# Patient Record
Sex: Female | Born: 1975 | Race: White | Hispanic: No | Marital: Married | Smoking: Former smoker
Health system: Southern US, Community
[De-identification: ages and names within clinical notes are randomized; demographics above are authoritative.]

## PROBLEM LIST (undated history)

## (undated) DIAGNOSIS — Z789 Other specified health status: Secondary | ICD-10-CM

## (undated) HISTORY — DX: Other specified health status: Z78.9

## (undated) HISTORY — PX: COLON SURGERY: SHX602

## (undated) HISTORY — PX: BREAST SURGERY: SHX581

---

## 2011-11-12 LAB — HM MAMMOGRAPHY

## 2012-09-21 ENCOUNTER — Ambulatory Visit: Payer: Self-pay | Admitting: Obstetrics and Gynecology

## 2013-08-09 IMAGING — US ULTRASOUND RIGHT BREAST
1 series · 14 of 20 positions shown · non-contrast
Comparison: none

REASON FOR EXAM: RT BR MASS 10 OCLOCK
COMMENTS:

PROCEDURE:     US  - US BREAST RIGHT  - September 21, 2012  [DATE]
RESULT:     Right breast ultrasound reveals a hypoechoic solid nodule at
[DATE] in the right breast. Ultrasound directed needle localization for
surgical removal can be obtained as a tiny tumor cannot be excluded.

[Series 1: ultrasound right breast · 0.08mm/px · 14 of 20 slices shown]
[im 1/20]
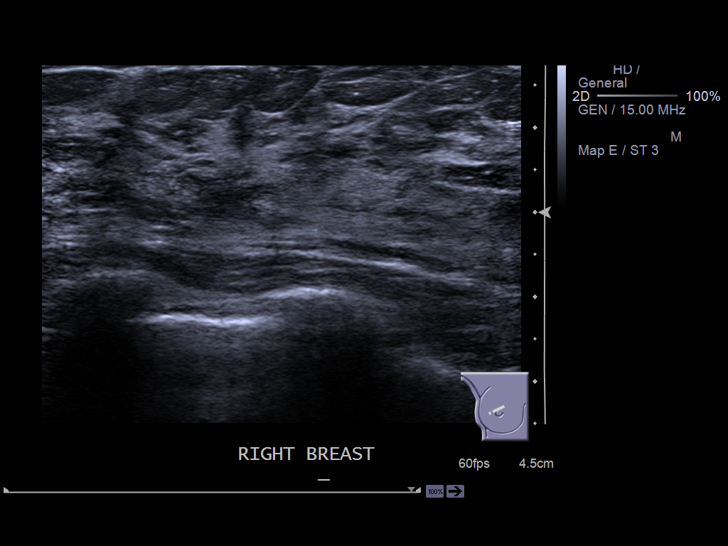
[im 3/20]
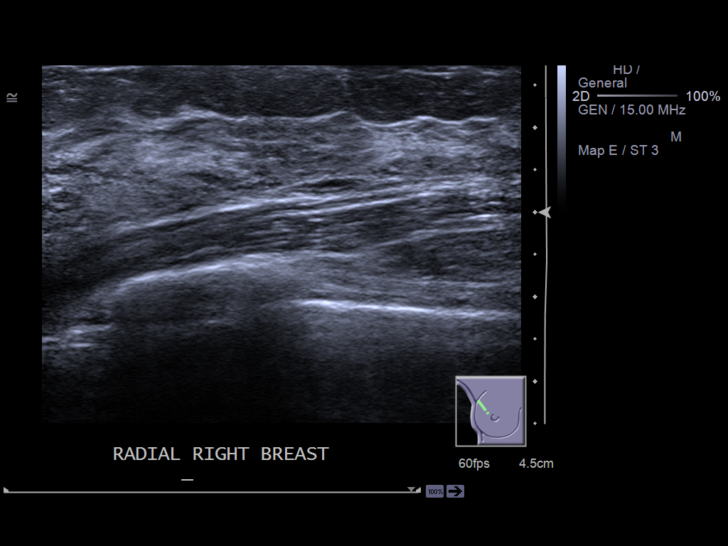
[im 4/20]
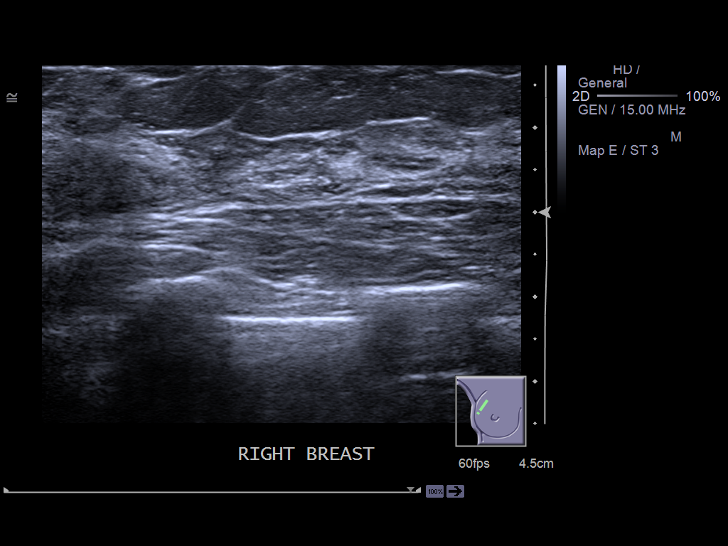
[im 6/20]
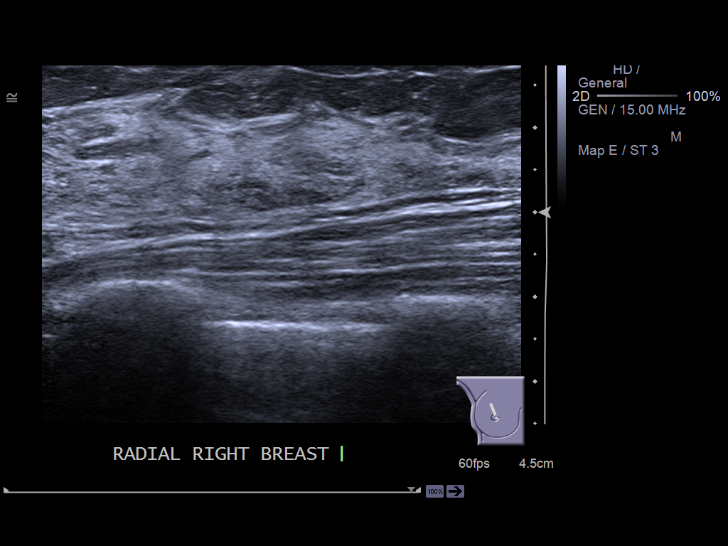
[im 7/20]
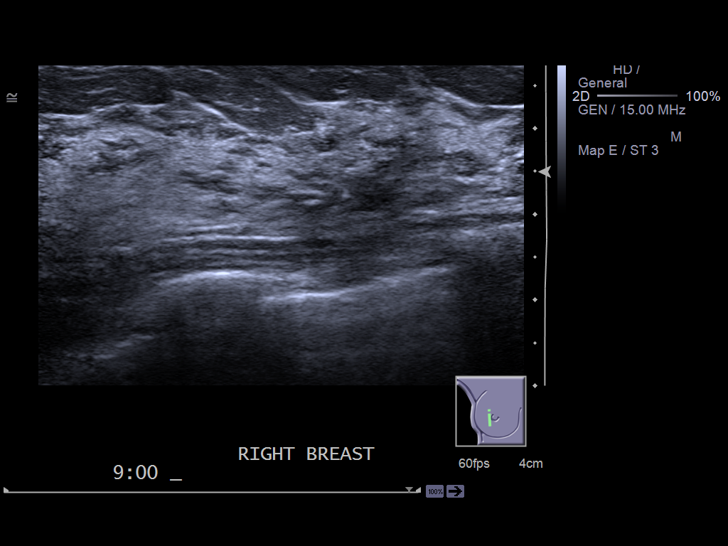
[im 8/20]
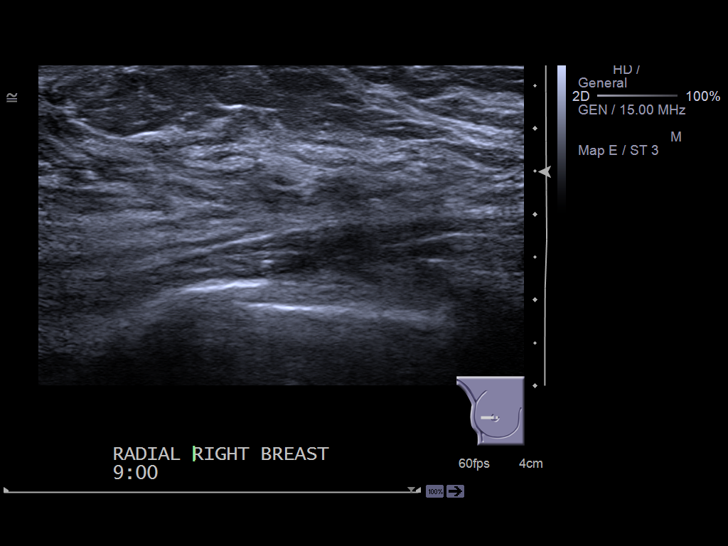
[im 10/20]
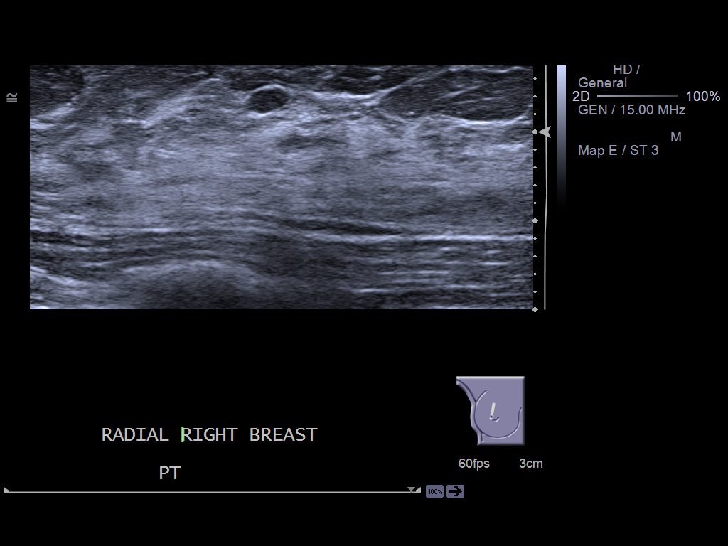
[im 11/20]
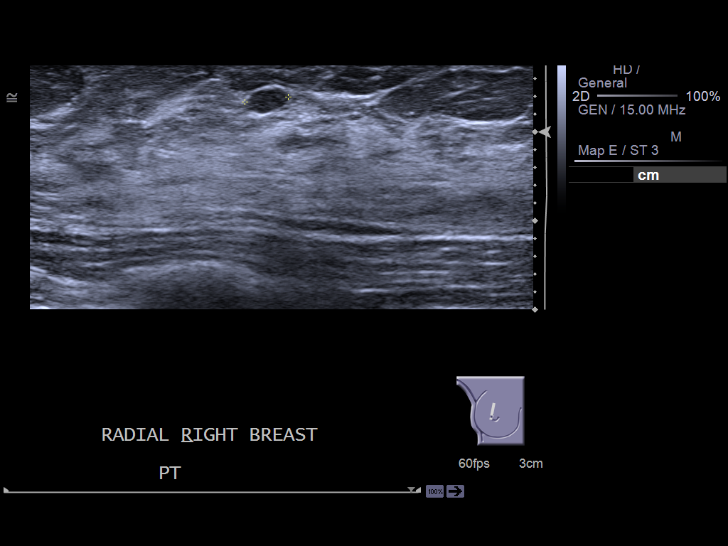
[im 13/20]
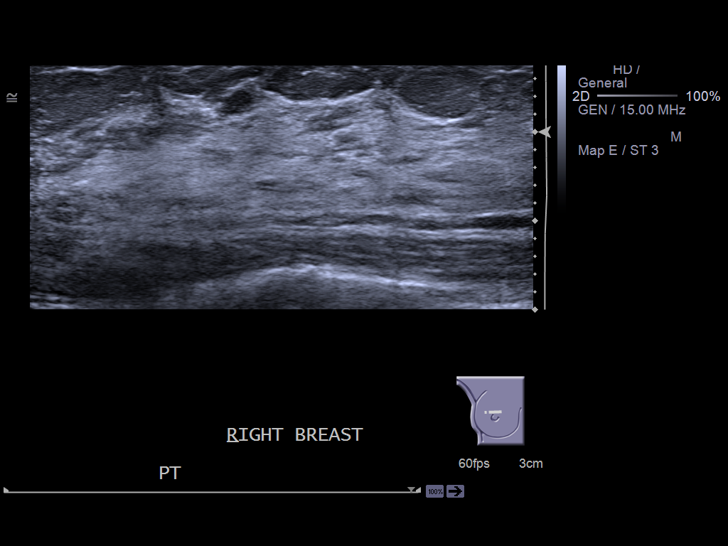
[im 14/20]
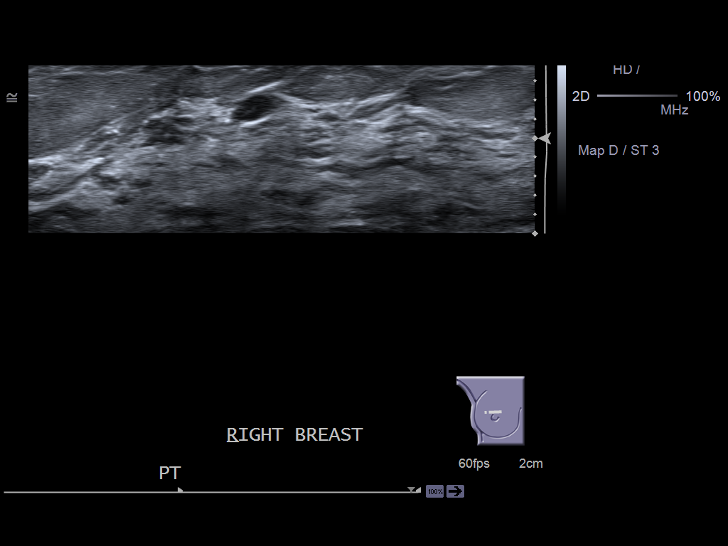
[im 16/20]
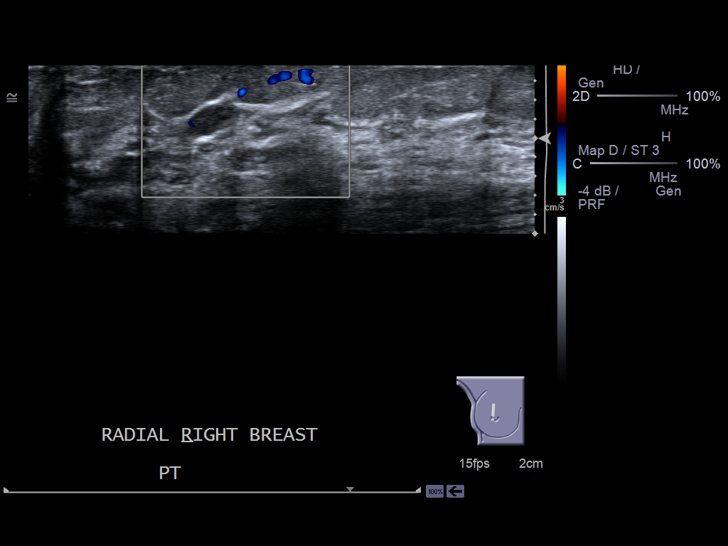
[im 17/20]
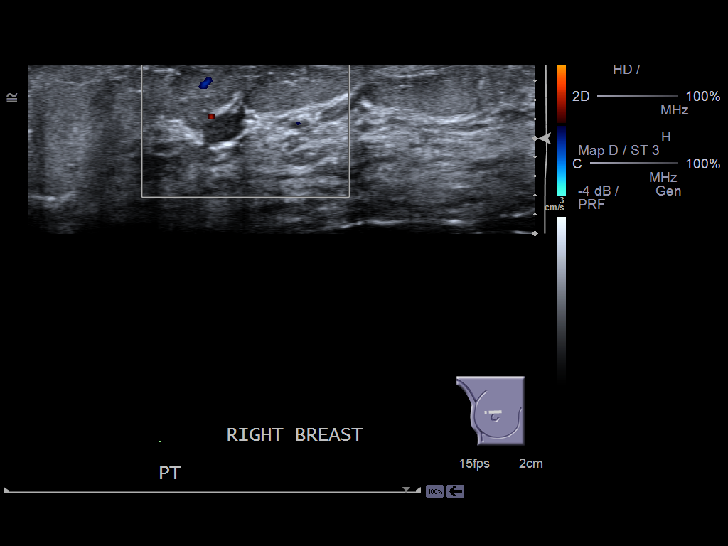
[im 18/20]
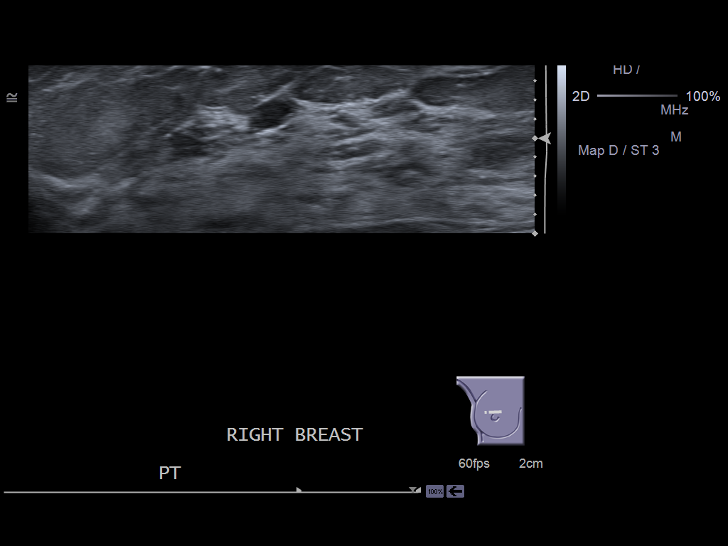
[im 20/20]
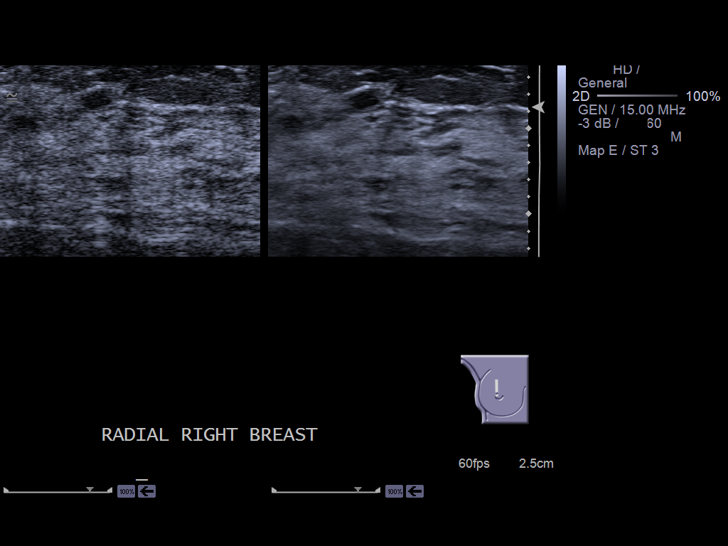

[14 of 20 positions shown; findings below may reference images not displayed]

IMPRESSION: 5 mm solid nodule [DATE] right breast for which ultrasound
directed needle localization for surgical removal can be obtained.

BI-RADS: Category 4 - Suspicious Abnormality - Biopsy Should Be Considered

A NEGATIVE MAMMOGRAM REPORT DOES NOT PRECLUDE BIOPSY OR OTHER EVALUATION OF
A CLINICALLY PALPABLE OR OTHERWISE SUSPICIOUS MASS OR LESION. BREAST CANCER
MAY NOT BE DETECTED IN UP TO 10% OF CASES.

## 2013-08-09 IMAGING — MG MAM DGTL DIAGNOSTIC MAMMO W/CAD
1 series · 8 of 8 positions shown · non-contrast
Comparison: none

REASON FOR EXAM: RT BR MASS 10 OCLOCK
COMMENTS:

[R CC · right · 8 of 8 slices shown]
[im 1/8]
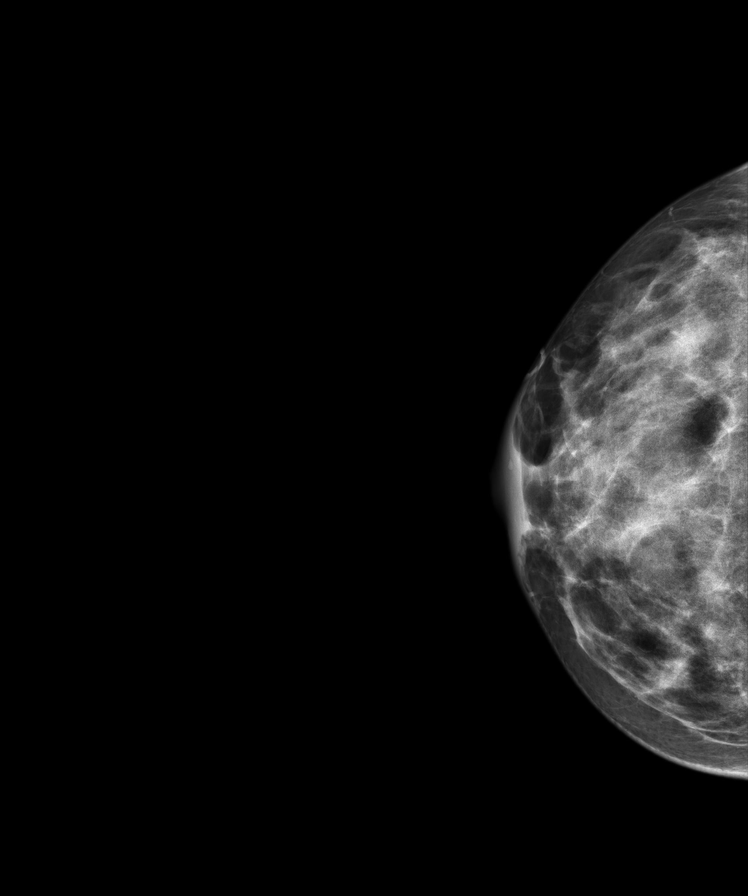
[im 2/8]
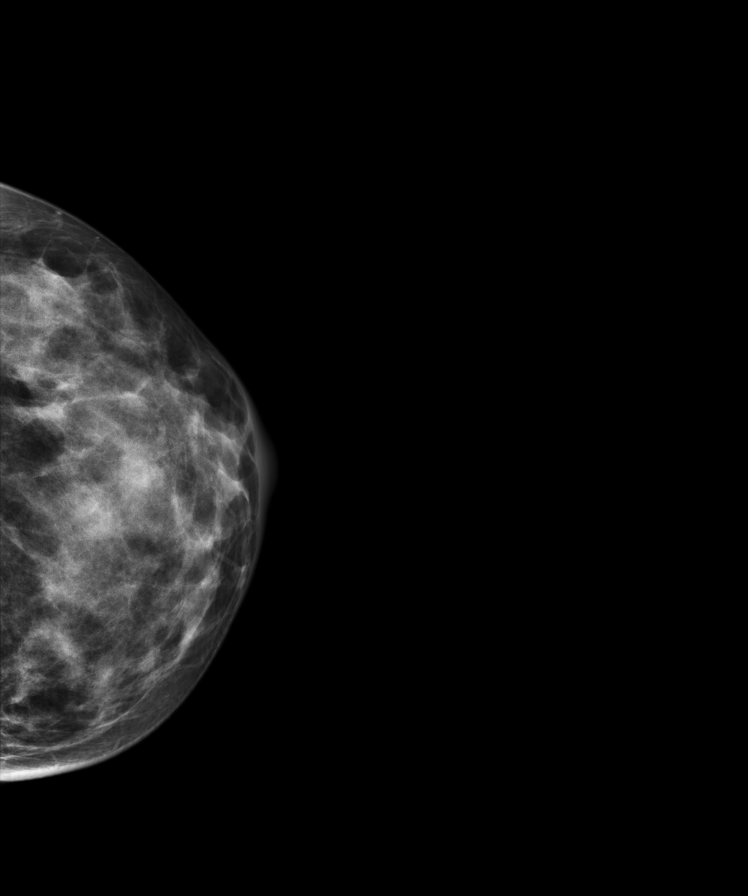
[im 3/8]
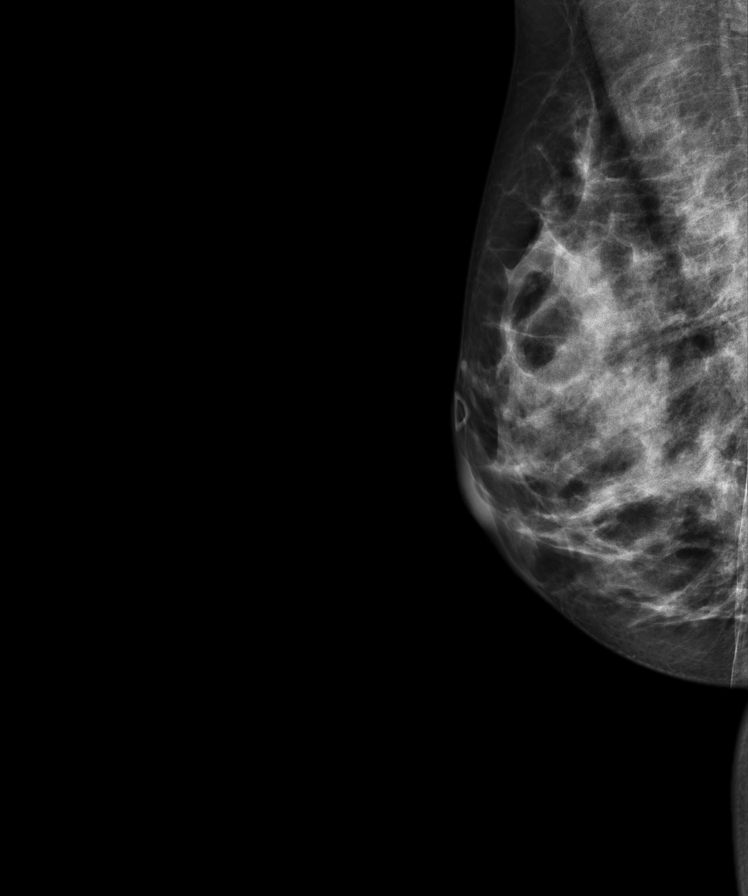
[im 4/8]
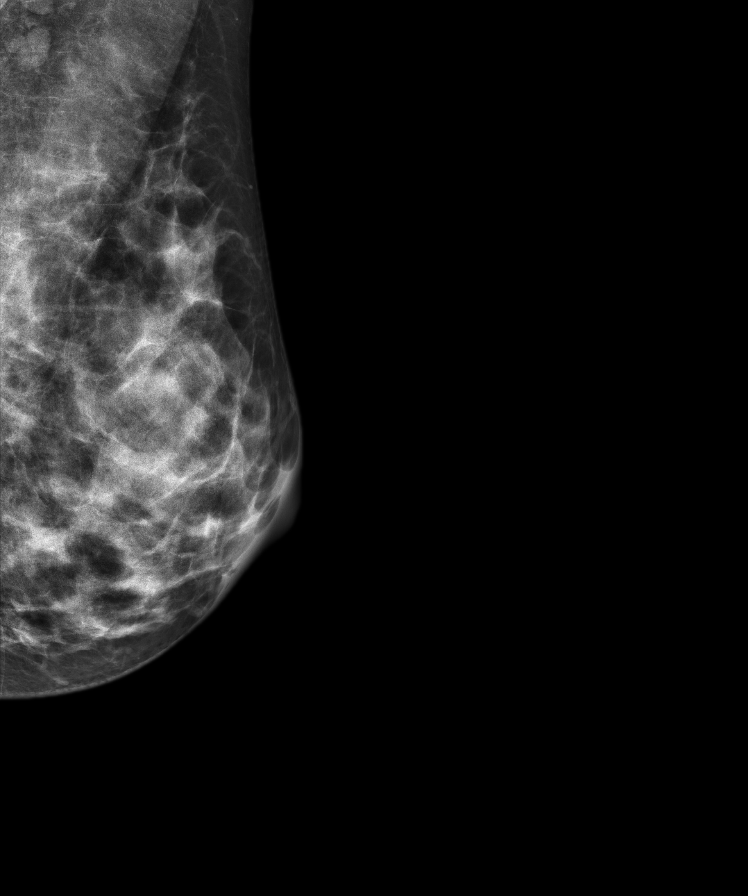
[im 5/8]
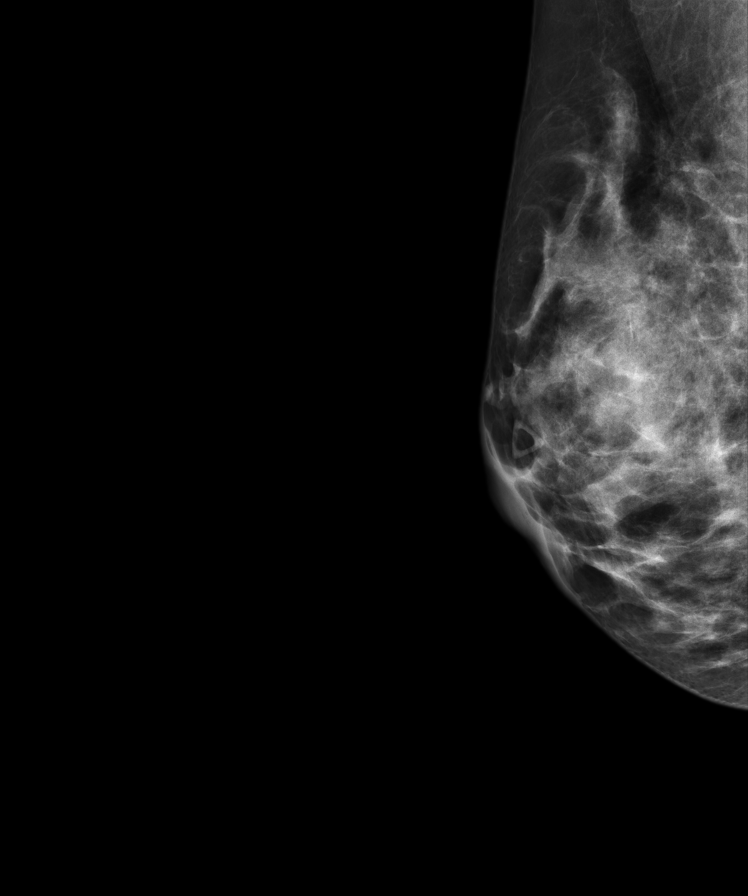
[im 6/8]
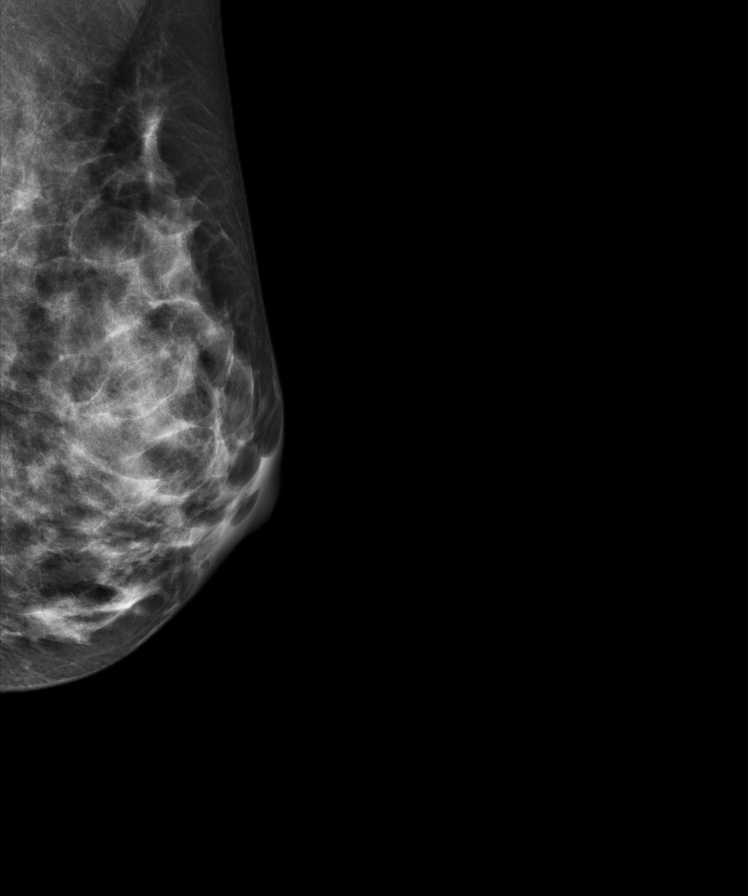
[im 7/8]
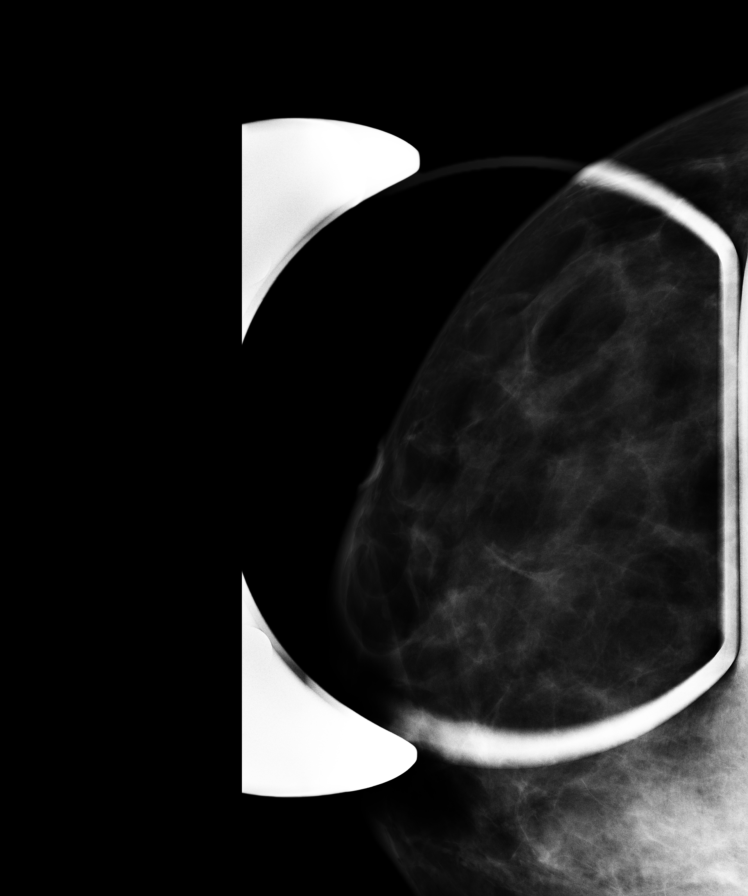
[im 8/8]
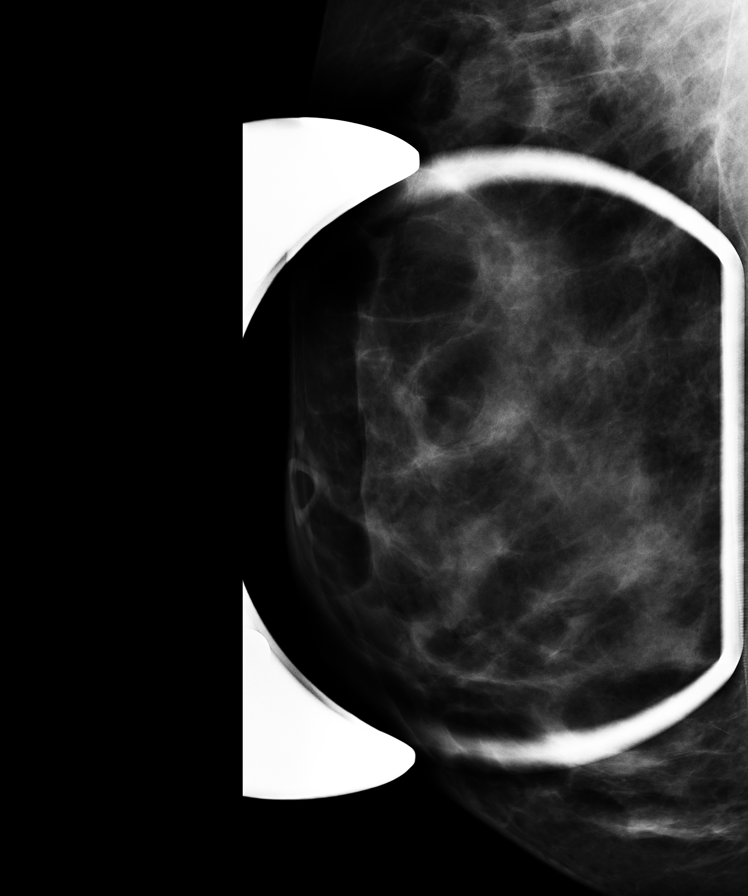

[8 of 8 positions shown; findings below may reference images not displayed]

PROCEDURE:     MAM - MAM DGTL DIAGNOSTIC MAMMO W/CAD  - September 21, 2012  [DATE]

RESULT:     No prior studies available for comparison. Breast are dense. No
mass pathologic clustered calcification. CAD evaluation nonfocal. Small
axillary lymph nodes. Ultrasound reveals a 5 mm solid nodule at [DATE] in the
right breast for which ultrasound directed needle localization for surgical
removal to exclude tumor suggested.
IMPRESSION: BI-RADS: Category 4 - Suspicious Abnormality - Biopsy
Should Be Considered

A NEGATIVE MAMMOGRAM REPORT DOES NOT PRECLUDE BIOPSY OR OTHER EVALUATION OF
A CLINICALLY PALPABLE OR OTHERWISE SUSPICIOUS MASS OR LESION. BREAST CANCER
MAY NOT BE DETECTED IN UP TO 10% OF CASES.

## 2014-04-16 ENCOUNTER — Ambulatory Visit: Payer: Self-pay | Admitting: Emergency Medicine

## 2014-04-18 ENCOUNTER — Ambulatory Visit: Payer: Self-pay | Admitting: Physician Assistant

## 2014-07-01 LAB — CBC AND DIFFERENTIAL: HEMOGLOBIN: 14.6 g/dL (ref 12.0–16.0)

## 2015-09-20 ENCOUNTER — Other Ambulatory Visit: Payer: Self-pay | Admitting: Internal Medicine

## 2015-09-20 ENCOUNTER — Encounter: Payer: Self-pay | Admitting: Internal Medicine

## 2015-09-20 DIAGNOSIS — M545 Low back pain, unspecified: Secondary | ICD-10-CM | POA: Insufficient documentation

## 2015-09-20 DIAGNOSIS — N6019 Diffuse cystic mastopathy of unspecified breast: Secondary | ICD-10-CM | POA: Insufficient documentation

## 2016-10-30 DIAGNOSIS — D241 Benign neoplasm of right breast: Secondary | ICD-10-CM | POA: Insufficient documentation

## 2016-10-30 HISTORY — DX: Benign neoplasm of right breast: D24.1

## 2017-10-14 HISTORY — PX: BREAST EXCISIONAL BIOPSY: SUR124

## 2019-09-07 DIAGNOSIS — N182 Chronic kidney disease, stage 2 (mild): Secondary | ICD-10-CM | POA: Insufficient documentation

## 2019-09-07 DIAGNOSIS — Z841 Family history of disorders of kidney and ureter: Secondary | ICD-10-CM | POA: Insufficient documentation

## 2020-02-26 DIAGNOSIS — Z8371 Family history of colonic polyps: Secondary | ICD-10-CM | POA: Insufficient documentation

## 2020-02-26 DIAGNOSIS — Z83719 Family history of colon polyps, unspecified: Secondary | ICD-10-CM | POA: Insufficient documentation

## 2020-02-26 DIAGNOSIS — D126 Benign neoplasm of colon, unspecified: Secondary | ICD-10-CM | POA: Insufficient documentation

## 2021-06-07 ENCOUNTER — Other Ambulatory Visit: Payer: Self-pay

## 2021-06-07 ENCOUNTER — Ambulatory Visit
Admission: RE | Admit: 2021-06-07 | Discharge: 2021-06-07 | Disposition: A | Discharge status: Home / Self Care | Payer: Self-pay | Source: Ambulatory Visit | Attending: Sports Medicine | Admitting: Sports Medicine

## 2021-06-07 VITALS — BP 124/86 | HR 95 | Temp 99.0°F | Resp 16 | Ht 63.0 in | Wt 150.0 lb

## 2021-06-07 DIAGNOSIS — J029 Acute pharyngitis, unspecified: Secondary | ICD-10-CM | POA: Insufficient documentation

## 2021-06-07 DIAGNOSIS — H68001 Unspecified Eustachian salpingitis, right ear: Secondary | ICD-10-CM | POA: Insufficient documentation

## 2021-06-07 DIAGNOSIS — R0981 Nasal congestion: Secondary | ICD-10-CM | POA: Insufficient documentation

## 2021-06-07 DIAGNOSIS — R059 Cough, unspecified: Secondary | ICD-10-CM | POA: Insufficient documentation

## 2021-06-07 DIAGNOSIS — H938X1 Other specified disorders of right ear: Secondary | ICD-10-CM | POA: Insufficient documentation

## 2021-06-07 LAB — POCT RAPID STREP A: Streptococcus, Group A Screen (Direct): NEGATIVE

## 2021-06-07 MED ORDER — PROMETHAZINE-DM 6.25-15 MG/5ML PO SYRP: 5.0000 mL | ORAL_SOLUTION | Freq: Four times a day (QID) | ORAL | 0 refills | Status: DC | PRN

## 2021-06-07 MED ORDER — FLUTICASONE PROPIONATE 50 MCG/ACT NA SUSP: 2.0000 | Freq: Every day | NASAL | 0 refills | Status: DC

## 2021-06-07 NOTE — Discharge Instructions (Addendum)
As we discussed, your strep test is negative. Your symptoms are very consistent with the recent COVID positive test that you have had.  This may linger.  It is a viral process, no antibiotics are indicated. That said, I did prescribe you a cough medicine, and a nasal steroid to help you with your congestion and your ear pressure. Please see educational handouts. If symptoms persist please see your primary care provider. I have to advise you, that if your symptoms do worsen then you need to seek out care in emergency room setting.

## 2021-06-07 NOTE — ED Provider Notes (Signed)
[facility]    CSN: [id number] Arrival date & time: 06/07/21  1605      History   Chief Complaint Chief Complaint  Patient presents with   Sore Throat    HPI Mikayla Bradley is a 45 y.o. female.   44 year old female who presents for evaluation of a sore throat.  She said her sore throat began about 6 days ago.  She actually took a home COVID test and was positive.  She quarantine for the past 5 days.  Her throat is still bothering her and she also has an associated cough.  She normally goes to [facility] family [facility] but was unable to get an appointment.  She is not currently working outside of the home.  She says the cough is quite irritating and sometimes is keeping her awake at night.  No fever shakes chills.  She thinks the cough is irritating her throat.  No abdominal or urinary symptoms.  No history of asthma.  No smoking.  No wheezing.  She also has associated nasal congestion and some mild right-sided ear pressure.  She has been vaccinated x2 but no booster.  No flu shot.  No chest pain or shortness of breath.  She denies any major medical issues and does not take medicines on a regular basis.  No red flag signs or symptoms elicited on history.   History reviewed. No pertinent past medical history.  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 No pertinent surgical history.  OB History   No obstetric history on file.      Home Medications    Prior to Admission medications   Medication Sig Start Date End Date Taking? Authorizing Provider  fluticasone (FLONASE) 50 MCG/ACT nasal spray Place 2 sprays into both nostrils daily. 06/07/21  Yes Verda Cumins, MD  levonorgestrel [facility]) 20 MCG/24HR IUD by Intrauterine route.   Yes [provider]  promethazine-dextromethorphan (PROMETHAZINE-DM) 6.25-15 MG/5ML syrup Take 5 mLs by mouth 4 (four) times daily as  needed for cough. 06/07/21  Yes Verda Cumins, MD    Family History History reviewed. No pertinent family history.  Social History Social History   Tobacco Use   Smoking status: Never   Smokeless tobacco: Never  Substance Use Topics   Alcohol use: Yes    Alcohol/week: 0.0 standard drinks     Allergies   Patient has no known allergies.   Review of Systems Review of Systems  Constitutional:  Negative for activity change, appetite change, chills, diaphoresis, fatigue and fever.  HENT:  Positive for congestion, ear pain and sore throat. Negative for ear discharge, postnasal drip, rhinorrhea, sinus pressure, sinus pain and sneezing.   Eyes:  Negative for pain.  Respiratory:  Positive for cough. Negative for chest tightness, shortness of breath and wheezing.   Cardiovascular:  Negative for chest pain and palpitations.  Gastrointestinal:  Negative for abdominal pain, diarrhea, nausea and vomiting.  Genitourinary:  Negative for dysuria.  Musculoskeletal:  Negative for back pain, myalgias and neck pain.  Skin:  Negative for color change, pallor, rash and wound.  Neurological:  Negative for dizziness, light-headedness and headaches.  All other systems reviewed and are negative.   Physical Exam Triage Vital Signs ED Triage Vitals [06/07/21 1620]  Enc Vitals Group     BP      Pulse      Resp  Temp      Temp src      SpO2      Weight 150 lb (68 kg)     Height '5\' 3"'$  (1.6 m)     Head Circumference      Peak Flow      Pain Score 7     Pain Loc      Pain Edu?      Excl. in [place]?    No data found.  Updated Vital Signs BP 124/86 (BP Location: Left Arm)   Pulse 95   Temp 99 F (37.2 C) (Oral)   Resp 16   Ht '5\' 3"'$  (1.6 m)   Wt 68 kg   SpO2 97%   BMI 26.57 kg/m   Visual Acuity Right Eye Distance:   Left Eye Distance:   Bilateral Distance:    Right Eye Near:   Left Eye Near:    Bilateral Near:     Physical Exam Vitals and nursing note reviewed.   Constitutional:      General: She is not in acute distress.    Appearance: Normal appearance. She is well-developed. She is not ill-appearing, toxic-appearing or diaphoretic.  HENT:     Head: Normocephalic and atraumatic.     Right Ear: Tympanic membrane normal.     Left Ear: Tympanic membrane normal.     Nose: Congestion present. No rhinorrhea.     Mouth/Throat:     Mouth: Mucous membranes are moist. No oral lesions.     Pharynx: Uvula midline. Posterior oropharyngeal erythema present. No pharyngeal swelling, oropharyngeal exudate or uvula swelling.     Tonsils: No tonsillar exudate or tonsillar abscesses. 0 on the right. 0 on the left.  Eyes:     Extraocular Movements:     Right eye: Normal extraocular motion.     Left eye: Normal extraocular motion.     Conjunctiva/sclera: Conjunctivae normal.     Pupils: Pupils are equal, round, and reactive to light.  Cardiovascular:     Rate and Rhythm: Normal rate and regular rhythm.     Pulses: Normal pulses.     Heart sounds: Normal heart sounds. No murmur heard.   No friction rub. No gallop.  Pulmonary:     Effort: Pulmonary effort is normal.     Breath sounds: Normal breath sounds. No stridor. No wheezing, rhonchi or rales.  Musculoskeletal:     Cervical back: Normal range of motion and neck supple.  Skin:    General: Skin is warm and dry.     Capillary Refill: Capillary refill takes less than 2 seconds.     Findings: No erythema or rash.  Neurological:     General: No focal deficit present.     Mental Status: She is alert and oriented to person, place, and time.     UC Treatments / Results  Labs (all labs ordered are listed, but only abnormal results are displayed) Labs Reviewed  CULTURE, GROUP A STREP [facility])  POCT RAPID STREP A, ED / UC  POCT RAPID STREP A    EKG   Radiology No results found.  Procedures Procedures (including critical care time)  Medications Ordered in UC Medications - No data to  display  Initial Impression / Assessment and Plan / UC Course  I have reviewed the triage vital signs and the nursing notes.  Pertinent labs & imaging results that were available during my care of the patient were reviewed by me and considered in my medical decision  making (see chart for details).  Clinical impression: 1.  Sore throat 2.  Cough 3.  Nasal congestion 4.  Right ear pressure 5.  Inflammation of the right eustachian tube  Treatment plan: 1.  The findings and treatment plan were discussed in detail with the patient.  Patient was in agreement. 2.  I did indicate to her that her symptoms are consistent with her recent COVID positive test.  She is currently afebrile but is having cough, nasal congestion and ear discomfort.  We will treat accordingly. 3.  Did send in a prescription for cough medicine, and Flonase.  This should help her. 4.  Supportive care, over-the-counter meds as needed, Tylenol or Motrin for any fever or discomfort.  Plenty of rest and plenty fluids. 5.  Educational handouts provided. 6.  If symptoms persist she should see her PCP. 7.  If symptoms worsen that she should go to the ER. 8.  She was discharged in stable condition will follow-up here as needed.    Final Clinical Impressions(s) / UC Diagnoses   Final diagnoses:  Sore throat  Cough  Nasal congestion  Ear pressure, right  Inflammation of right eustachian tube     Discharge Instructions      As we discussed, your strep test is negative. Your symptoms are very consistent with the recent COVID positive test that you have had.  This may linger.  It is a viral process, no antibiotics are indicated. That said, I did prescribe you a cough medicine, and a nasal steroid to help you with your congestion and your ear pressure. Please see educational handouts. If symptoms persist please see your primary care provider. I have to advise you, that if your symptoms do worsen then you need to seek out  care in emergency room setting.     ED Prescriptions     Medication Sig Dispense Auth. Provider   promethazine-dextromethorphan (PROMETHAZINE-DM) 6.25-15 MG/5ML syrup Take 5 mLs by mouth 4 (four) times daily as needed for cough. 180 mL Verda Cumins, MD   fluticasone [facility]) 50 MCG/ACT nasal spray Place 2 sprays into both nostrils daily. 15.8 mL Verda Cumins, MD      PDMP not reviewed this encounter.   Verda Cumins, MD 06/07/21 [phone]

## 2021-06-07 NOTE — ED Triage Notes (Signed)
Pt here with C/O sore throat started Friday night, tested at home Covid was positive Saturday morning. Well cough up phlegm in the mornings but no other SX. No fever, cough.

## 2021-06-10 LAB — CULTURE, GROUP A STREP (THRC)

## 2022-01-14 ENCOUNTER — Other Ambulatory Visit: Payer: Self-pay

## 2022-01-14 ENCOUNTER — Ambulatory Visit: Payer: BC Managed Care – PPO | Admitting: Nurse Practitioner

## 2022-01-14 ENCOUNTER — Encounter: Payer: Self-pay | Admitting: Nurse Practitioner

## 2022-01-14 VITALS — BP 122/70 | Temp 98.0°F | Resp 16 | Ht 63.0 in | Wt 159.7 lb

## 2022-01-14 DIAGNOSIS — Z124 Encounter for screening for malignant neoplasm of cervix: Secondary | ICD-10-CM

## 2022-01-14 DIAGNOSIS — D126 Benign neoplasm of colon, unspecified: Secondary | ICD-10-CM | POA: Diagnosis not present

## 2022-01-14 DIAGNOSIS — Z13220 Encounter for screening for lipoid disorders: Secondary | ICD-10-CM

## 2022-01-14 DIAGNOSIS — D241 Benign neoplasm of right breast: Secondary | ICD-10-CM

## 2022-01-14 DIAGNOSIS — Z130 Encounter for screening for diseases of the blood and blood-forming organs and certain disorders involving the immune mechanism: Secondary | ICD-10-CM

## 2022-01-14 DIAGNOSIS — Z1231 Encounter for screening mammogram for malignant neoplasm of breast: Secondary | ICD-10-CM

## 2022-01-14 DIAGNOSIS — Z131 Encounter for screening for diabetes mellitus: Secondary | ICD-10-CM

## 2022-01-14 DIAGNOSIS — Z8371 Family history of colonic polyps: Secondary | ICD-10-CM

## 2022-01-14 DIAGNOSIS — Z7689 Persons encountering health services in other specified circumstances: Secondary | ICD-10-CM

## 2022-01-14 DIAGNOSIS — Z3009 Encounter for other general counseling and advice on contraception: Secondary | ICD-10-CM | POA: Diagnosis not present

## 2022-01-14 NOTE — Progress Notes (Signed)
? ?BP 122/70   Temp 98 ?F (36.7 ?C) (Oral)   Resp 16   Ht '5\' 3"'$  (1.6 m)   Wt 159 lb 11.2 oz (72.4 kg)   SpO2 99%   BMI 28.29 kg/m?   ? ?Subjective:  ? ? Patient ID: Mikayla Bradley, female    DOB: 03-08-1976, 46 y.o.   MRN: [id number] ? ?HPI: ?Mikayla Bradley is a 46 y.o. female, here alone ? ?Chief Complaint  ?Patient presents with  ? Establish Care  ? ?Establish care: Last physical was done in 2020, she says that everything was normal. No medical history. She had her first colonoscopy last year, she did have some polyps they were removed and she is due for another in five years. She is due for pap and mammogram. ? ?Polyps: she has a family history of colon polyps.  She recently did a colonoscopy and had some polyps removed. She will be due for another colonoscopy in five years. She denies any bloody stools.  ? ?Fibroadenoma of right breast: She says that she had a fibroadenoma of her right breast but had it removed in 05/19/2018.  She says she is due for a mammogram.  Mammogram ordered.  ? ?IUD: she says she has an IUD in place and it is good for one more year.  She would like to go to [facility] for her GYN care.  Referral placed.  She says she is due for her pap and will get it done there.  ? ? ?Relevant past medical, surgical, family and social history reviewed and updated as indicated. Interim medical history since our last visit reviewed. ?Allergies and medications reviewed and updated. ? ?Review of Systems ? ?Constitutional: Negative for fever or weight change.  ?Respiratory: Negative for cough and shortness of breath.   ?Cardiovascular: Negative for chest pain or palpitations.  ?Gastrointestinal: Negative for abdominal pain, no bowel changes.  ?Musculoskeletal: Negative for gait problem or joint swelling.  ?Skin: Negative for rash.  ?Neurological: Negative for dizziness or headache.  ?No other specific complaints in a complete review of systems (except as listed in HPI above).  ? ?   ?Objective:  ?   ?BP 122/70   Temp 98 ?F (36.7 ?C) (Oral)   Resp 16   Ht '5\' 3"'$  (1.6 m)   Wt 159 lb 11.2 oz (72.4 kg)   SpO2 99%   BMI 28.29 kg/m?   ?Wt Readings from Last 3 Encounters:  ?01/14/22 159 lb 11.2 oz (72.4 kg)  ?06/07/21 150 lb (68 kg)  ?  ?Physical Exam ? ?Constitutional: Patient appears well-developed and well-nourished.  No distress.  ?HEENT: head atraumatic, normocephalic, pupils equal and reactive to light,  neck supple ?Cardiovascular: Normal rate, regular rhythm and normal heart sounds.  No murmur heard. No BLE edema. ?Pulmonary/Chest: Effort normal and breath sounds normal. No respiratory distress. ?Abdominal: Soft.  There is no tenderness. ?Psychiatric: Patient has a normal mood and affect. behavior is normal. Judgment and thought content normal.  ? ?Results for orders placed or performed during the hospital encounter of 06/07/21  ?Culture, group A strep  ? Specimen: Throat  ?Result Value Ref Range  ? Specimen Description    ?  THROAT ?Performed at [facility], [address], [place], [place] [address] ?  ? Special Requests    ?  NONE ?Performed at [facility], [address] [place], [place] [address] ?  ? Culture    ?  NO GROUP A STREP (  S.PYOGENES) ISOLATED ?Performed at [facility], [place] [address], [place], [place] [address] ?  ? Report Status 06/10/2021 FINAL   ?POCT rapid strep A  ?Result Value Ref Range  ? Streptococcus, Group A Screen (Direct) NEGATIVE NEGATIVE  ? ?   ?Assessment & Plan:  ? ?1. Adenomatous polyp of colon, unspecified part of colon ?-stay up to date with colonoscopies ? ?2. Family history of colonic polyps ?-stay up to date with colonoscopies ? ?3. Fibroadenoma of breast, right ?-has been removed ?- MM Digital Screening; Future ? ?4. Counseling for birth control regarding intrauterine device (IUD) ?- Ambulatory referral to Gynecology ? ?5. Encounter for screening mammogram for malignant neoplasm of breast ? ?- MM Digital Screening; Future ? ?6.  Screening for diabetes mellitus ? ?- COMPLETE METABOLIC PANEL WITH GFR ?- Hemoglobin A1c ? ?7. Screening for cholesterol level ? ?- Lipid panel ? ?8. Screening for cervical cancer ? ?- Ambulatory referral to Gynecology ? ?9. Screening for deficiency anemia ? ?- CBC with Differential/Platelet ? ?10. Encounter to establish care ?-schedule for cpe ? ?Follow up plan: ?Return in about 3 months (around 04/15/2022). ? ? ? ? ? ?

## 2022-01-15 LAB — CBC WITH DIFFERENTIAL/PLATELET
Absolute Monocytes: 520 cells/uL (ref 200–950)
Basophils Absolute: 40 cells/uL (ref 0–200)
Basophils Relative: 0.5 %
Eosinophils Absolute: 48 cells/uL (ref 15–500)
Eosinophils Relative: 0.6 %
HCT: 41.3 % (ref 35.0–45.0)
Hemoglobin: 13.8 g/dL (ref 11.7–15.5)
Lymphs Abs: 1784 cells/uL (ref 850–3900)
MCH: 31.2 pg (ref 27.0–33.0)
MCHC: 33.4 g/dL (ref 32.0–36.0)
MCV: 93.2 fL (ref 80.0–100.0)
MPV: 11.3 fL (ref 7.5–12.5)
Monocytes Relative: 6.5 %
Neutro Abs: 5608 cells/uL (ref 1500–7800)
Neutrophils Relative %: 70.1 %
Platelets: 278 10*3/uL (ref 140–400)
RBC: 4.43 10*6/uL (ref 3.80–5.10)
RDW: 11.8 % (ref 11.0–15.0)
Total Lymphocyte: 22.3 %
WBC: 8 10*3/uL (ref 3.8–10.8)

## 2022-01-15 LAB — COMPLETE METABOLIC PANEL WITH GFR
AG Ratio: 2.5 (calc) (ref 1.0–2.5)
ALT: 10 U/L (ref 6–29)
AST: 12 U/L (ref 10–30)
Albumin: 4.8 g/dL (ref 3.6–5.1)
Alkaline phosphatase (APISO): 45 U/L (ref 31–125)
BUN: 13 mg/dL (ref 7–25)
CO2: 24 mmol/L (ref 20–32)
Calcium: 9.9 mg/dL (ref 8.6–10.2)
Chloride: 105 mmol/L (ref 98–110)
Creat: 0.91 mg/dL (ref 0.50–0.99)
Globulin: 1.9 g/dL (calc) (ref 1.9–3.7)
Glucose, Bld: 82 mg/dL (ref 65–99)
Potassium: 4.2 mmol/L (ref 3.5–5.3)
Sodium: 140 mmol/L (ref 135–146)
Total Bilirubin: 2 mg/dL — ABNORMAL HIGH (ref 0.2–1.2)
Total Protein: 6.7 g/dL (ref 6.1–8.1)
eGFR: 80 mL/min/{1.73_m2} (ref >=60)

## 2022-01-15 LAB — LIPID PANEL
Cholesterol: 196 mg/dL (ref <200)
HDL: 79 mg/dL (ref >=50)
LDL Cholesterol (Calc): 102 mg/dL (calc) — ABNORMAL HIGH
Non-HDL Cholesterol (Calc): 117 mg/dL (calc) (ref <130)
Total CHOL/HDL Ratio: 2.5 (calc) (ref <5.0)
Triglycerides: 68 mg/dL (ref <150)

## 2022-01-15 LAB — HEMOGLOBIN A1C
Hgb A1c MFr Bld: 4.8 % of total Hgb (ref <5.7)
Mean Plasma Glucose: 91 mg/dL
eAG (mmol/L): 5 mmol/L

## 2022-01-21 ENCOUNTER — Telehealth: Payer: Self-pay

## 2022-01-21 NOTE — Telephone Encounter (Signed)
[facility] referring for annual exam including pap and contraceptive management . Sch w PA or CNM. Called and left voicemail for patient to call back to be scheduled.

## 2022-01-21 NOTE — Telephone Encounter (Signed)
-----   Message from Excell Seltzer, RN sent at 01/17/2022  9:09 AM EDT ----- Regarding: referral Schedule with PA or CNM for annual exam including pap and contraceptive management in 4-6 weeks.

## 2022-01-22 NOTE — Telephone Encounter (Signed)
Pt is scheduled with ABC on 5/16 at 3:15.   ?

## 2022-02-11 ENCOUNTER — Ambulatory Visit: Payer: BC Managed Care – PPO | Admitting: Nurse Practitioner

## 2022-02-26 ENCOUNTER — Ambulatory Visit (INDEPENDENT_AMBULATORY_CARE_PROVIDER_SITE_OTHER): Payer: BC Managed Care – PPO | Admitting: Obstetrics and Gynecology

## 2022-02-26 ENCOUNTER — Other Ambulatory Visit (HOSPITAL_COMMUNITY)
Admission: RE | Admit: 2022-02-26 | Discharge: 2022-02-26 | Disposition: A | Discharge status: Home / Self Care | Payer: BC Managed Care – PPO | Source: Ambulatory Visit | Attending: Obstetrics and Gynecology | Admitting: Obstetrics and Gynecology

## 2022-02-26 ENCOUNTER — Encounter: Payer: Self-pay | Admitting: Obstetrics and Gynecology

## 2022-02-26 VITALS — BP 110/74 | Ht 64.0 in | Wt 152.0 lb

## 2022-02-26 DIAGNOSIS — Z1151 Encounter for screening for human papillomavirus (HPV): Secondary | ICD-10-CM

## 2022-02-26 DIAGNOSIS — Z1231 Encounter for screening mammogram for malignant neoplasm of breast: Secondary | ICD-10-CM

## 2022-02-26 DIAGNOSIS — Z124 Encounter for screening for malignant neoplasm of cervix: Secondary | ICD-10-CM | POA: Insufficient documentation

## 2022-02-26 DIAGNOSIS — Z01419 Encounter for gynecological examination (general) (routine) without abnormal findings: Secondary | ICD-10-CM | POA: Diagnosis not present

## 2022-02-26 DIAGNOSIS — Z30431 Encounter for routine checking of intrauterine contraceptive device: Secondary | ICD-10-CM

## 2022-02-26 NOTE — Patient Instructions (Signed)
I value your feedback and you entrusting us with your care. If you get a [facility] patient survey, I would appreciate you taking the time to let us know about your experience today. Thank you! ? ? ?

## 2022-02-26 NOTE — Progress Notes (Signed)
? ?PCP: Bo Merino, FNP ? ? ?Chief Complaint  ?Patient presents with  ? Gynecologic Exam  ?  No concerns  ? ? ?HPI: ?     [name] is a 46 y.o. No obstetric history on file. whose LMP was No LMP recorded. (Menstrual status: IUD)., presents today for her NP annual examination.  Her menses are absent with Mirena, placed 2/19. No BTB, occas dysmen.   ? ?Sex activity: single partner, contraception - IUD. Mirena replaced 2/19; has had several through the years. She does not have vaginal dryness. ? ?Last Pap: not recent; no hx of abn paps with tx.  ? ?Last mammogram: 05/07/18 at [facility]; Results were: normal--routine follow-up in 12 months. S/p RT breast fibroadenoma exc 2019 ?There is no FH of breast cancer. There is no FH of ovarian cancer. The patient does do self-breast exams. ? ?Colonoscopy: 5/21 at [facility] with polyps; FH colon polyps; Repeat due after 5 years.  ? ?Tobacco use: The patient denies current or previous tobacco use. ?Alcohol use: social drinker ?No drug use ?Exercise: moderately active ? ?She does get adequate calcium but not Vitamin D in her diet. ? ?Labs with PCP. ? ? ?Patient Active Problem List  ? Diagnosis Date Noted  ? Adenomatous polyp of colon 02/26/2020  ? Family history of colonic polyps 02/26/2020  ? Family history of kidney disease 09/07/2019  ? Stage 2 chronic kidney disease 09/07/2019  ? Fibroadenoma of breast, right 10/30/2016  ? 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 LBP (low back pain) 09/20/2015  ? ? ?Past Surgical History:  ?Procedure Laterality Date  ? BREAST SURGERY    ? COLON SURGERY    ? ? ?Family History  ?Problem Relation Age of Onset  ? Colon polyps Mother   ? ? ?Social History  ? ?Socioeconomic History  ? Marital status: Married  ?  Spouse name: Everette  ? Number of children: 1  ? Years of education: Not on file  ? Highest education level: Not on file  ?Occupational History  ? Not on file  ?Tobacco Use  ? Smoking status: Former  ?  Types: Cigarettes  ?  Quit date: 2005  ?   Years since quitting: 18.3  ? Smokeless tobacco: Never  ?Vaping Use  ? Vaping Use: Never used  ?Substance and Sexual Activity  ? Alcohol use: Yes  ?  Alcohol/week: 0.0 standard drinks  ? Drug use: Never  ? Sexual activity: Yes  ?  Partners: Male  ?  Birth control/protection: I.U.D.  ?  Comment: Mirena  ?Other Topics Concern  ? Not on file  ?Social History Narrative  ?  Pre K Teacher   ? ?Social Determinants of Health  ? ?Financial Resource Strain: Not on file  ?Food Insecurity: Not on file  ?Transportation Needs: Not on file  ?Physical Activity: Not on file  ?Stress: Not on file  ?Social Connections: Not on file  ?Intimate Partner Violence: Not on file  ? ? ? ?Current Outpatient Medications:  ?  levonorgestrel (MIRENA) 20 MCG/DAY IUD, 1 each by Intrauterine route once., Disp: , Rfl:  ? ? ? ? ?ROS: ? ?Review of Systems  ?Constitutional:  Negative for fatigue, fever and unexpected weight change.  ?Respiratory:  Negative for cough, shortness of breath and wheezing.   ?Cardiovascular:  Negative for chest pain, palpitations and leg swelling.  ?Gastrointestinal:  Negative for blood in stool, constipation, diarrhea, nausea and vomiting.  ?Endocrine: Negative for cold intolerance, heat intolerance and polyuria.  ?  Genitourinary:  Negative for dyspareunia, dysuria, flank pain, frequency, genital sores, hematuria, menstrual problem, pelvic pain, urgency, vaginal bleeding, vaginal discharge and vaginal pain.  ?Musculoskeletal:  Negative for back pain, joint swelling and myalgias.  ?Skin:  Negative for rash.  ?Neurological:  Negative for dizziness, syncope, light-headedness, numbness and headaches.  ?Hematological:  Negative for adenopathy.  ?Psychiatric/Behavioral:  Negative for agitation, confusion, sleep disturbance and suicidal ideas. The patient is not nervous/anxious.   ?BREAST: No symptoms ? ? ? ?Objective: ?BP 110/74   Ht '5\' 4"'$  (1.626 m)   Wt 152 lb (68.9 kg)   BMI 26.09 kg/m?  ? ? ?Physical Exam ?Constitutional:   ?    Appearance: She is well-developed.  ?Genitourinary:  ?   Vulva normal.  ?   Right Labia: No rash, tenderness or lesions. ?   Left Labia: No tenderness, lesions or rash. ?   No vaginal discharge, erythema or tenderness.  ? ?   Right Adnexa: not tender and no mass present. ?   Left Adnexa: not tender and no mass present. ?   No cervical friability or polyp.  ?   IUD strings visualized.  ?   Uterus is not enlarged or tender.  ?Breasts: ?   Right: No mass, nipple discharge, skin change or tenderness.  ?   Left: No mass, nipple discharge, skin change or tenderness.  ?Neck:  ?   Thyroid: No thyromegaly.  ?Cardiovascular:  ?   Rate and Rhythm: Normal rate and regular rhythm.  ?   Heart sounds: Normal heart sounds. No murmur heard. ?Pulmonary:  ?   Effort: Pulmonary effort is normal.  ?   Breath sounds: Normal breath sounds.  ?Abdominal:  ?   Palpations: Abdomen is soft.  ?   Tenderness: There is no abdominal tenderness. There is no guarding or rebound.  ?Musculoskeletal:     ?   General: Normal range of motion.  ?   Cervical back: Normal range of motion.  ?Lymphadenopathy:  ?   Cervical: No cervical adenopathy.  ?Neurological:  ?   General: No focal deficit present.  ?   Mental Status: She is alert and oriented to person, place, and time.  ?   Cranial Nerves: No cranial nerve deficit.  ?Skin: ?   General: Skin is warm and dry.  ?Psychiatric:     ?   Mood and Affect: Mood normal.     ?   Behavior: Behavior normal.     ?   Thought Content: Thought content normal.     ?   Judgment: Judgment normal.  ?Vitals reviewed.  ? ? ?Assessment/Plan: ? ?Encounter for annual routine gynecological examination ? ?Cervical cancer screening - Plan: Cytology - PAP ? ?Screening for HPV (human papillomavirus) - Plan: Cytology - PAP ? ?Encounter for routine checking of intrauterine contraceptive device (IUD)--IUD strings in cx os; has 8 yr indication now ? ?Encounter for screening mammogram for malignant neoplasm of breast - Plan: MM 3D SCREEN  BREAST BILATERAL; pt to schedule at [place].  ? ?       ?GYN counsel breast self exam, mammography screening, adequate intake of calcium and vitamin D, diet and exercise ? ?  F/U ? Return in about 1 year (around 02/27/2023). ? ?Tatumn Corbridge B. Gee Habig, PA-C ?02/26/2022 ?3:40 PM ? ?

## 2022-02-28 LAB — CYTOLOGY - PAP
Comment: NEGATIVE
Diagnosis: NEGATIVE
High risk HPV: NEGATIVE

## 2022-03-05 ENCOUNTER — Inpatient Hospital Stay
Admission: RE | Admit: 2022-03-05 | Discharge: 2022-03-05 | Disposition: A | Discharge status: Home / Self Care | Payer: Self-pay | Source: Ambulatory Visit | Attending: *Deleted | Admitting: *Deleted

## 2022-03-05 ENCOUNTER — Other Ambulatory Visit: Payer: Self-pay | Admitting: *Deleted

## 2022-03-05 DIAGNOSIS — Z1231 Encounter for screening mammogram for malignant neoplasm of breast: Secondary | ICD-10-CM

## 2022-04-03 ENCOUNTER — Ambulatory Visit: Payer: BC Managed Care – PPO

## 2022-04-23 ENCOUNTER — Ambulatory Visit
Admission: RE | Admit: 2022-04-23 | Discharge: 2022-04-23 | Disposition: A | Discharge status: Home / Self Care | Payer: BC Managed Care – PPO | Source: Ambulatory Visit | Attending: Obstetrics and Gynecology | Admitting: Obstetrics and Gynecology

## 2022-04-23 DIAGNOSIS — Z1231 Encounter for screening mammogram for malignant neoplasm of breast: Secondary | ICD-10-CM | POA: Insufficient documentation

## 2022-04-24 ENCOUNTER — Encounter (INDEPENDENT_AMBULATORY_CARE_PROVIDER_SITE_OTHER): Payer: Self-pay

## 2022-04-24 ENCOUNTER — Other Ambulatory Visit: Payer: Self-pay | Admitting: Obstetrics and Gynecology

## 2022-04-24 ENCOUNTER — Ambulatory Visit (INDEPENDENT_AMBULATORY_CARE_PROVIDER_SITE_OTHER): Payer: BC Managed Care – PPO | Admitting: Nurse Practitioner

## 2022-04-24 ENCOUNTER — Encounter: Payer: Self-pay | Admitting: Nurse Practitioner

## 2022-04-24 ENCOUNTER — Other Ambulatory Visit: Payer: Self-pay

## 2022-04-24 VITALS — BP 110/70 | HR 100 | Temp 98.3°F | Resp 16 | Ht 63.0 in | Wt 153.1 lb

## 2022-04-24 DIAGNOSIS — R928 Other abnormal and inconclusive findings on diagnostic imaging of breast: Secondary | ICD-10-CM

## 2022-04-24 DIAGNOSIS — Z0000 Encounter for general adult medical examination without abnormal findings: Secondary | ICD-10-CM | POA: Diagnosis not present

## 2022-04-24 DIAGNOSIS — N6489 Other specified disorders of breast: Secondary | ICD-10-CM

## 2022-04-24 NOTE — Progress Notes (Signed)
Name: Mikayla Bradley   MRN: [id number]    DOB: September 01, 1976   Date:04/24/2022       Progress Note  Subjective  Chief Complaint  Chief Complaint  Patient presents with  . Annual Exam    HPI  Patient presents for annual CPE.  Diet: well balanced included dairy Exercise: walks a couple times a week.  Sleep:6-8 hours Stress: She does have increased stress.  Her husband was recently diagnosed with pancreatic cancer.  He is undergoing chemotherapy treatment at this time.  [place] Office Visit from 04/24/2022 in [facility]  AUDIT-C Score 0      Depression: Phq 9 is  negative    04/24/2022    9:36 AM 01/14/2022    2:45 PM  Depression screen PHQ 2/9  Decreased Interest 0 0  Down, Depressed, Hopeless 1 0  PHQ - 2 Score 1 0  Tired, decreased energy 0   Change in appetite 0   Feeling bad or failure about yourself  0   Trouble concentrating 0   Moving slowly or fidgety/restless 0   Suicidal thoughts 0   Difficult doing work/chores Not difficult at all    Hypertension: BP Readings from Last 3 Encounters:  04/24/22 110/70  02/26/22 110/74  01/14/22 122/70   Obesity: Wt Readings from Last 3 Encounters:  04/24/22 153 lb 1.6 oz (69.4 kg)  02/26/22 152 lb (68.9 kg)  01/14/22 159 lb 11.2 oz (72.4 kg)   BMI Readings from Last 3 Encounters:  04/24/22 27.12 kg/m  02/26/22 26.09 kg/m  01/14/22 28.29 kg/m     Vaccines:  HPV: up to at age 45 , ask insurance if age between 12-45  Shingrix: 2-64 yo and ask insurance if covered when patient above 26 yo Pneumonia:  educated and discussed with patient. Flu:  educated and discussed with patient.  Hep C Screening: postponed till next visit STD testing and prevention (HIV/chl/gon/syphilis): postponed till next visit Intimate partner violence:none Sexual History :sexually active with husband Menstrual History/LMP/Abnormal Bleeding: IUD Incontinence Symptoms: none  Breast cancer:  - Last Mammogram:  04/23/2022 - BRCA gene screening: none  Osteoporosis: Discussed high calcium and vitamin D supplementation, weight bearing exercises, she is taking vitamin d supplementation  Cervical cancer screening: 02/26/2022  Skin cancer: Discussed monitoring for atypical lesions  Colorectal cancer: 02/23/2020 Lung cancer:   Low Dose CT Chest recommended if Age 63-80 years, 24 pack-year currently smoking OR have quit w/in 15years. Patient does not qualify.   ECG: none  The 10-year ASCVD risk score (Arnett DK, et al., 2019) is: 0.3%   Values used to calculate the score:     Age: 46 years     Sex: Female     Is Non-Hispanic African American: No     Diabetic: No     Tobacco smoker: No     Systolic Blood Pressure: 488 mmHg     Is BP treated: No     HDL Cholesterol: 79 mg/dL     Total Cholesterol: 196 mg/dL   Advanced Care Planning: A voluntary discussion about advance care planning including the explanation and discussion of advance directives.  Discussed health care proxy and Living will, and the patient was able to identify a health care proxy as husband.  Patient does not have a living will at present time. If patient does have living will, I have requested they bring this to the clinic to be scanned in to their chart.  Lipids: Lab Results  Component Value  Date   CHOL 196 01/14/2022   Lab Results  Component Value Date   HDL 79 01/14/2022   Lab Results  Component Value Date   LDLCALC 102 (H) 01/14/2022   Lab Results  Component Value Date   TRIG 68 01/14/2022   Lab Results  Component Value Date   CHOLHDL 2.5 01/14/2022   No results found for: "LDLDIRECT"  Glucose: Glucose, Bld  Date Value Ref Range Status  01/14/2022 82 65 - 99 mg/dL Final    Comment:    .            Fasting reference interval .     Patient Active Problem List   Diagnosis Date Noted  . Adenomatous polyp of colon 02/26/2020  . Family history of colonic polyps 02/26/2020  . Family history of kidney disease  09/07/2019  . Stage 2 chronic kidney disease 09/07/2019  . Fibroadenoma of breast, right 10/30/2016  . Bloodgood disease 09/20/2015  . LBP (low back pain) 09/20/2015    Past Surgical History:  Procedure Laterality Date  . BREAST EXCISIONAL BIOPSY Right 2019  . COLON SURGERY      Family History  Problem Relation Age of Onset  . Colon polyps Mother     Social History   Socioeconomic History  . Marital status: Married    Spouse name: Media planner  . Number of children: 1  . Years of education: Not on file  . Highest education level: Not on file  Occupational History  . Not on file  Tobacco Use  . Smoking status: Former    Types: Cigarettes    Quit date: 2005    Years since quitting: 18.5  . Smokeless tobacco: Never  Vaping Use  . Vaping Use: Never used  Substance and Sexual Activity  . Alcohol use: Yes    Alcohol/week: 0.0 standard drinks of alcohol  . Drug use: Never  . Sexual activity: Yes    Partners: Male    Birth control/protection: I.U.D.    Comment: Mirena  Other Topics Concern  . Not on file  Social History Narrative    Pre K Teacher    Social Determinants of Health   Financial Resource Strain: Low Risk  (04/24/2022)   Overall Financial Resource Strain (CARDIA)   . Difficulty of Paying Living Expenses: Not hard at all  Food Insecurity: No Food Insecurity (04/24/2022)   Hunger Vital Sign   . Worried About Charity fundraiser in the Last Year: Never true   . Ran Out of Food in the Last Year: Never true  Transportation Needs: No Transportation Needs (04/24/2022)   PRAPARE - Transportation   . Lack of Transportation (Medical): No   . Lack of Transportation (Non-Medical): No  Physical Activity: Insufficiently Active (04/24/2022)   Exercise Vital Sign   . Days of Exercise per Week: 2 days   . Minutes of Exercise per Session: 30 min  Stress: No Stress Concern Present (04/24/2022)   [place]    . Feeling of Stress : Only a little  Social Connections: Moderately Integrated (04/24/2022)   Social Connection and Isolation Panel [NHANES]   . Frequency of Communication with Friends and Family: Twice a week   . Frequency of Social Gatherings with Friends and Family: Twice a week   . Attends Religious Services: 1 to 4 times per year   . Active Member of Clubs or Organizations: No   . Attends Archivist Meetings: Never   .  Marital Status: Married  Human resources officer Violence: Not At Risk (04/24/2022)   Humiliation, Afraid, Rape, and Kick questionnaire   . Fear of Current or Ex-Partner: No   . Emotionally Abused: No   . Physically Abused: No   . Sexually Abused: No     Current Outpatient Medications:  .  levonorgestrel (MIRENA) 20 MCG/DAY IUD, 1 each by Intrauterine route once. (Patient not taking: Reported on 04/24/2022), Disp: , Rfl:   No Known Allergies   ROS  Constitutional: Negative for fever or weight change.  Respiratory: Negative for cough and shortness of breath.   Cardiovascular: Negative for chest pain or palpitations.  Gastrointestinal: Negative for abdominal pain, no bowel changes.  Musculoskeletal: Negative for gait problem or joint swelling.  Skin: Negative for rash.  Neurological: Negative for dizziness or headache.  No other specific complaints in a complete review of systems (except as listed in HPI above).   Objective  Vitals:   04/24/22 0936  BP: 110/70  Pulse: 100  Resp: 16  Temp: 98.3 F (36.8 C)  TempSrc: Oral  SpO2: 98%  Weight: 153 lb 1.6 oz (69.4 kg)  Height: '5\' 3"'  (1.6 m)    Body mass index is 27.12 kg/m.  Physical Exam  Constitutional: Patient appears well-developed and well-nourished. No distress.  HENT: Head: Normocephalic and atraumatic. Ears: B TMs ok, no erythema or effusion; Nose: Nose normal. Mouth/Throat: Oropharynx is clear and moist. No oropharyngeal exudate.  Eyes: Conjunctivae and EOM are normal. Pupils are equal,  round, and reactive to light. No scleral icterus.  Neck: Normal range of motion. Neck supple. No JVD present. No thyromegaly present.  Cardiovascular: Normal rate, regular rhythm and normal heart sounds.  No murmur heard. No BLE edema. Pulmonary/Chest: Effort normal and breath sounds normal. No respiratory distress. Abdominal: Soft. Bowel sounds are normal, no distension. There is no tenderness. no masses Musculoskeletal: Normal range of motion, no joint effusions. No gross deformities Neurological: he is alert and oriented to person, place, and time. No cranial nerve deficit. Coordination, balance, strength, speech and gait are normal.  Skin: Skin is warm and dry. No rash noted. No erythema.  Psychiatric: Patient has a normal mood and affect. behavior is normal. Judgment and thought content normal.   Recent Results (from the past 2160 hour(s))  Cytology - PAP     Status: None   Collection Time: 02/26/22  3:21 PM  Result Value Ref Range   High risk HPV Negative    Adequacy      Satisfactory for evaluation; transformation zone component PRESENT.   Diagnosis      - Negative for intraepithelial lesion or malignancy (NILM)   Comment Normal Reference Range HPV - Negative      Fall Risk:    04/24/2022    9:35 AM 01/14/2022    2:44 PM  [place] in the past year? 0 0  Number falls in past yr: 0 0  Injury with Fall? 0 0  Follow up Falls evaluation completed Falls evaluation completed     Functional Status Survey: Is the patient deaf or have difficulty hearing?: No Does the patient have difficulty seeing, even when wearing glasses/contacts?: No Does the patient have difficulty concentrating, remembering, or making decisions?: No Does the patient have difficulty walking or climbing stairs?: No Does the patient have difficulty dressing or bathing?: No Does the patient have difficulty doing errands alone such as visiting a doctor's office or shopping?: No   Assessment &  Plan  1. Annual physical exam She is up to date on colonoscopy, mammogram and pap.  Recently had labs done  -USPSTF grade A and B recommendations reviewed with patient; age-appropriate recommendations, preventive care, screening tests, etc discussed and encouraged; healthy living encouraged; see AVS for patient education given to patient -Discussed importance of 150 minutes of physical activity weekly, eat two servings of fish weekly, eat one serving of tree nuts ( cashews, pistachios, pecans, almonds.Marland Kitchen) every other day, eat 6 servings of fruit/vegetables daily and drink plenty of water and avoid sweet beverages.

## 2022-05-13 ENCOUNTER — Ambulatory Visit
Admission: RE | Admit: 2022-05-13 | Discharge: 2022-05-13 | Disposition: A | Discharge status: Home / Self Care | Payer: BC Managed Care – PPO | Source: Ambulatory Visit | Attending: Obstetrics and Gynecology | Admitting: Obstetrics and Gynecology

## 2022-05-13 DIAGNOSIS — R928 Other abnormal and inconclusive findings on diagnostic imaging of breast: Secondary | ICD-10-CM

## 2022-05-13 DIAGNOSIS — N6489 Other specified disorders of breast: Secondary | ICD-10-CM | POA: Diagnosis present

## 2023-01-08 ENCOUNTER — Ambulatory Visit (INDEPENDENT_AMBULATORY_CARE_PROVIDER_SITE_OTHER): Payer: BC Managed Care – PPO | Admitting: Physician Assistant

## 2023-01-08 ENCOUNTER — Other Ambulatory Visit: Payer: Self-pay

## 2023-01-08 VITALS — BP 122/68 | HR 109 | Temp 98.5°F | Resp 16 | Ht 64.0 in | Wt 158.8 lb

## 2023-01-08 DIAGNOSIS — R509 Fever, unspecified: Secondary | ICD-10-CM

## 2023-01-08 DIAGNOSIS — R197 Diarrhea, unspecified: Secondary | ICD-10-CM

## 2023-01-08 DIAGNOSIS — K529 Noninfective gastroenteritis and colitis, unspecified: Secondary | ICD-10-CM

## 2023-01-08 DIAGNOSIS — R1031 Right lower quadrant pain: Secondary | ICD-10-CM | POA: Diagnosis not present

## 2023-01-08 LAB — POCT INFLUENZA A/B
Influenza A, POC: NEGATIVE
Influenza B, POC: NEGATIVE

## 2023-01-08 NOTE — Patient Instructions (Addendum)
  Please try to stay hydrated with plenty of water and electrolyte replacement drinks like Gatorade and Pedialyte Try to limit excess sugar intake as this can make diarrhea worse  I recommend small meals comprised of bland foods: boiled chicken, plain rice, bananas, applesauce, toast etc For the next day or so I recommend liquid meals like soup or broth then gradually incorporating bland foods  Once you start to feel better you can start to slowly reintroduce your normal diet   Please be mindful of the pain in your right lower abdomen. If the pain gets worse, you develop fevers that are not improving with Tylenol, you are unable to drink or eat - go to the ED and tell them you are concerned for your appendix  You can take small amounts of Pepto and Imodium as needed - think half doses twice per day- to help with symptoms   Most GI viruses resolve in 2-5 days but if you continue to have symptoms for the next 5-7 please let us know.   It was nice to meet you and I appreciate the opportunity to be involved in your care If you were satisfied with the care you received from me, I would greatly appreciate you saying so in the after-visit survey that is sent out following our visit.

## 2023-01-08 NOTE — Progress Notes (Signed)
Acute Office Visit   Patient: Mikayla Bradley   DOB: 08-07-1976   47 y.o. Female  MRN: [id number] Visit Date: 01/08/2023  Today's healthcare provider: Dani Gobble Emil Klassen, PA-C  Introduced myself to the patient as a Journalist, newspaper and provided education on APPs in clinical practice.    Chief Complaint  Patient presents with   Fever    101.3 last night 100.1 today, headache, bodyache for 2 days   Diarrhea   Subjective    Fever  Associated symptoms include abdominal pain, diarrhea, headaches, nausea and vomiting. Pertinent negatives include no congestion, coughing, rash, sore throat or wheezing.  Diarrhea  Associated symptoms include abdominal pain, a fever, headaches, myalgias and vomiting. Pertinent negatives include no coughing.   HPI     Fever    Additional comments: 101.3 last night 100.1 today, headache, bodyache for 2 days      Last edited by Hollie Salk, RMA on 01/08/2023 11:28 AM.       Concern for viral illness  Onset: sudden Duration: started 2 days ago (early Tuesday morning)   She reports fevers with tmax 101.3 so far She reports diarrhea, body aches, headaches as well  She states she has had bouts of diarrhea all day yesterday along with vomiting She reports stools are liquid and have gone from brown to more yellow "bile" color as of today   She has some abdominal pain that radiates to low back  States low abdominal pain was sharp last night but today is more aching and sore  She has had some vomiting this morning   She reports she is able to tolerate liquid intake but has not been eating much She reports some nausea with trying to eat food  Interventions: Tylenol last night for fever   Recent sick contacts:  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 (Patient not taking: Reported on 04/24/2022)   No facility-administered medications prior to visit.    Review of Systems  Constitutional:  Positive for fever.  HENT:  Negative for congestion, nosebleeds, postnasal drip, rhinorrhea, sinus pain and sore throat.   Respiratory:  Negative for cough, shortness of breath and wheezing.   Gastrointestinal:  Positive for abdominal pain, diarrhea, nausea and vomiting. Negative for blood in stool.  Musculoskeletal:  Positive for myalgias.  Skin:  Negative for rash.  Neurological:  Positive for headaches.       Objective    BP 122/68   Pulse (!) 109   Temp 98.5 F (36.9 C) (Oral)   Resp 16   Ht 5\' 4"  (1.626 m)   Wt 158 lb 12.8 oz (72 kg)   SpO2 97%   BMI 27.26 kg/m    Physical Exam Vitals reviewed.  Constitutional:      General: She is awake.     Appearance: Normal appearance. She is well-developed and well-groomed.  HENT:     Head: Normocephalic and atraumatic.  Eyes:     General: Lids are normal. Gaze aligned appropriately.     Extraocular Movements: Extraocular movements intact.     Conjunctiva/sclera: Conjunctivae normal.  Cardiovascular:     Rate and Rhythm: Regular rhythm.  Tachycardia present.     Pulses: Normal pulses.     Heart sounds: Normal heart sounds. No murmur heard.    No friction rub. No gallop.  Pulmonary:     Effort: Pulmonary effort is normal.     Breath sounds: Normal breath sounds.  Abdominal:     General: Abdomen is flat. Bowel sounds are normal.     Palpations: Abdomen is soft.     Tenderness: There is abdominal tenderness in the right lower quadrant. There is no right CVA tenderness, left CVA tenderness, guarding or rebound. Positive signs include McBurney's sign. Negative signs include Murphy's sign.  Musculoskeletal:     Cervical back: Normal range of motion and neck supple.  Neurological:     Mental  Status: She is alert.  Psychiatric:        Behavior: Behavior is cooperative.       Results for orders placed or performed in visit on 01/08/23  Novel Coronavirus, NAA ([facility])   Specimen: Nasopharyngeal(NP) swabs in vial transport medium   Nasopharynge  Previous  Result Value Ref Range   SARS-CoV-2, NAA Not Detected Not Detected  Specimen status report  Result Value Ref Range   specimen status report Comment   POCT Influenza A/B  Result Value Ref Range   Influenza A, POC Negative Negative   Influenza B, POC Negative Negative    Assessment & Plan      No follow-ups on file.       Problem List Items Addressed This Visit   None Visit Diagnoses     Gastroenteritis    -  Primary Acute, new concern  She reports nausea and persistent diarrhea since Tues along with intermittent fevers She reports suprapubic pain along with RLQ pain  Reviewed that abdominal tenderness and discomfort can accompany acute GI viral illnesses but there is concern for appendicitis given her fevers and RLQ pain  Reviewed getting stat US of RLQ/ stat CT scan for rule out- during scheduling of these she declined imaging stating that she felt better and did not think it was necessary  Reviewed ED and return precautions Recommend focusing on staying hydrated and using electrolyte supplements until she is better able to tolerate food intake Recommend she try small, bland meals first and then slowly reintroduce regular diet as tolerated Reviewed using Pepto and Imodium minimally to assist with symptom management Offered Zofran to assist with nausea but she declined this during apt Follow up as needed for persistent or progressing symptoms     Diarrhea of presumed infectious origin       Fever, unspecified fever cause     Acute, new concern Suspect this is likely related to current GI illness  Reviewed negative flu results with her during apt  Recommend tylenol for fever management as needed Recommend  increased hydration efforts     Relevant Orders   POCT Influenza A/B (Completed)   Novel Coronavirus, NAA ([facility]) (Completed)   Right lower quadrant abdominal pain    Reviewed that abdominal tenderness and discomfort can accompany acute GI viral illnesses but there is concern for appendicitis given her fevers and RLQ pain  [place] of appendix and then stat CT abdomen were ordered but during patient communication with referral/scheduling, patient declined imaging citing improvement in symptoms  Reviewed getting stat US of RLQ/ stat CT scan for rule out- during scheduling of these she declined imaging stating that she felt better and did not think it was necessary  Reviewed ED and return  precautions if symptoms recurred         No follow-ups on file.   I, Telly Broberg E Shakara Tweedy, PA-C, have reviewed all documentation for this visit. The documentation on 01/10/23 for the exam, diagnosis, procedures, and orders are all accurate and complete.   Talitha Givens, MHS, PA-C [facility]

## 2023-01-09 ENCOUNTER — Ambulatory Visit: Payer: BC Managed Care – PPO

## 2023-01-09 LAB — NOVEL CORONAVIRUS, NAA: SARS-CoV-2, NAA: NOT DETECTED

## 2023-01-09 LAB — SPECIMEN STATUS REPORT

## 2023-01-10 NOTE — Progress Notes (Signed)
COVID negative.

## 2023-08-26 ENCOUNTER — Other Ambulatory Visit: Payer: Self-pay | Admitting: Obstetrics and Gynecology

## 2023-08-26 DIAGNOSIS — Z1231 Encounter for screening mammogram for malignant neoplasm of breast: Secondary | ICD-10-CM

## 2023-09-02 ENCOUNTER — Ambulatory Visit
Admission: RE | Admit: 2023-09-02 | Discharge: 2023-09-02 | Disposition: A | Discharge status: Home / Self Care | Payer: BC Managed Care – PPO | Source: Ambulatory Visit | Attending: Obstetrics and Gynecology | Admitting: Obstetrics and Gynecology

## 2023-09-02 DIAGNOSIS — Z1231 Encounter for screening mammogram for malignant neoplasm of breast: Secondary | ICD-10-CM | POA: Insufficient documentation

## 2023-09-04 ENCOUNTER — Other Ambulatory Visit: Payer: Self-pay | Admitting: Obstetrics and Gynecology

## 2023-09-04 DIAGNOSIS — R928 Other abnormal and inconclusive findings on diagnostic imaging of breast: Secondary | ICD-10-CM

## 2023-09-15 ENCOUNTER — Ambulatory Visit
Admission: RE | Admit: 2023-09-15 | Discharge: 2023-09-15 | Disposition: A | Discharge status: Home / Self Care | Payer: BC Managed Care – PPO | Source: Ambulatory Visit | Attending: Obstetrics and Gynecology | Admitting: Obstetrics and Gynecology

## 2023-09-15 ENCOUNTER — Encounter: Payer: Self-pay | Admitting: Obstetrics and Gynecology

## 2023-09-15 ENCOUNTER — Ambulatory Visit
Admission: RE | Admit: 2023-09-15 | Discharge: 2023-09-15 | Disposition: A | Discharge status: Home / Self Care | Payer: BC Managed Care – PPO | Source: Ambulatory Visit | Attending: Obstetrics and Gynecology

## 2023-09-15 DIAGNOSIS — R928 Other abnormal and inconclusive findings on diagnostic imaging of breast: Secondary | ICD-10-CM

## 2024-01-28 ENCOUNTER — Ambulatory Visit: Admitting: Family Medicine

## 2024-01-28 ENCOUNTER — Encounter: Payer: Self-pay | Admitting: Family Medicine

## 2024-01-28 VITALS — BP 112/70 | HR 82 | Ht 63.0 in | Wt 151.4 lb

## 2024-01-28 DIAGNOSIS — Z7689 Persons encountering health services in other specified circumstances: Secondary | ICD-10-CM

## 2024-01-28 DIAGNOSIS — Z3009 Encounter for other general counseling and advice on contraception: Secondary | ICD-10-CM

## 2024-01-28 DIAGNOSIS — Z0000 Encounter for general adult medical examination without abnormal findings: Secondary | ICD-10-CM

## 2024-01-28 NOTE — Progress Notes (Signed)
 New Patient Office Visit  Subjective   Patient ID: Mikayla Bradley, female    DOB: 1976/02/04  Age: 48 y.o. MRN: [id number]  CC:  Chief Complaint  Patient presents with   Establish Care    Establish Care     HPI Mikayla Bradley is a 48 year old female who presents to establish with [facility] at [facility].   CC: Patient here to establish care  Last PCP: Zane Herald, last CPE 2023 Used to be with [facility]  Specialists: OBGYN  Mammogram: 09/01/2024 Cervical cancer screening: 02/26/2022 Colon cancer screening: 03/03/2020, q5 recall  She does not have concerns today, would like to complete annual physical with vitamin levels checked in the near future.    Lost husband to pancreatic cancer last summer 2024  Outpatient Encounter Medications as of 01/28/2024  Medication Sig   ketoconazole (NIZORAL) 2 % cream Apply topically.   levonorgestrel (MIRENA) 20 MCG/DAY IUD 1 each by Intrauterine route once.   No facility-administered encounter medications on file as of 01/28/2024.    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   Low back pain 09/20/2015   Past Medical History:  Diagnosis Date   No pertinent past medical history    Past Surgical History:  Procedure Laterality Date   BREAST EXCISIONAL BIOPSY Right 2019   COLON SURGERY     Family History  Problem Relation Age of Onset   Colon polyps Mother    Breast cancer Neg Hx    Social History   Socioeconomic History   Marital status: Married    Spouse name: Mikayla Bradley   Number of children: 1   Years of education: Not on file   Highest education level: Bachelor's degree (e.g., BA, AB, BS)  Occupational History   Not on file  Tobacco Use   Smoking status:  Former    Current packs/day: 0.00    Types: Cigarettes    Quit date: 2005    Years since quitting: 20.3    Passive exposure: Past   Smokeless tobacco: Never  Vaping Use   Vaping status: Never Used  Substance and Sexual Activity   Alcohol use: Yes    Alcohol/week: 0.0 standard drinks of alcohol   Drug use: Never   Sexual activity: Yes    Partners: Male    Birth control/protection: I.U.D.    Comment: Mirena  Other Topics Concern   Not on file  Social History Narrative    Pre K Teacher    Social Drivers of Health   Financial Resource Strain: Low Risk  (01/08/2023)   Overall Financial Resource Strain (CARDIA)    Difficulty of Paying Living Expenses: Not very hard  Food Insecurity: No Food Insecurity (01/08/2023)   Hunger Vital Sign    Worried About Running Out of Food in the Last Year: Never true    Ran Out of Food in the Last Year: Never true  Transportation Needs: No Transportation Needs (01/08/2023)   PRAPARE - Administrator, Civil Service (Medical): No    Lack of Transportation (Non-Medical): No  Physical Activity: Unknown (01/08/2023)   Exercise Vital Sign    Days of Exercise per Week: 0 days    Minutes of Exercise per Session: Not on file  Recent Concern: Physical Activity - Inactive (01/08/2023)   Exercise Vital Sign    Days of Exercise per Week: 0 days    Minutes of Exercise per Session: 30 min  Stress: No Stress Concern Present (01/08/2023)   [employer] of Occupational Health - Occupational Stress Questionnaire    Feeling of Stress : Only a little  Social Connections: Unknown (01/08/2023)   Social Connection and Isolation Panel [NHANES]    Frequency of Communication with Friends and Family: More than three times a week    Frequency of Social Gatherings with Friends and Family: Twice a week    Attends Religious Services: More than 4 times per year    Active Member of [employer] or Organizations: Patient declined    Attends Banker Meetings:  Not on file    Marital Status: Married  Intimate Partner Violence: Not At Risk (04/24/2022)   Humiliation, Afraid, Rape, and Kick questionnaire    Fear of Current or Ex-Partner: No    Emotionally Abused: No    Physically Abused: No    Sexually Abused: No   Outpatient Medications Prior to Visit  Medication Sig Dispense Refill   ketoconazole (NIZORAL) 2 % cream Apply topically.     levonorgestrel (MIRENA) 20 MCG/DAY IUD 1 each by Intrauterine route once.     No facility-administered medications prior to visit.   No Known Allergies  ROS: see HPI     Objective  Today's Vitals   01/28/24 1033  BP: 112/70  Pulse: 82  SpO2: 96%  Weight: 151 lb 6.4 oz (68.7 kg)  Height: 5\' 3"  (1.6 m)  PainSc: 0-No pain    GENERAL: Well-appearing, in NAD. Well nourished.  SKIN: Pink, warm and dry. No rash, lesion, ulceration, or ecchymoses.  RESPIRATORY: Chest wall symmetrical. Respirations even and non-labored. Breath sounds clear to auscultation bilaterally.  CARDIAC: S1, S2 present, regular rate and rhythm without murmur or gallops. Peripheral pulses 2+ bilaterally.  MSK: Muscle tone and strength appropriate for age. Joints w/o tenderness, redness, or swelling.  EXTREMITIES: Without clubbing, cyanosis, or edema.  NEUROLOGIC: No motor or sensory deficits. Steady, even gait. C2-C12 intact.  PSYCH/MENTAL STATUS: Alert, oriented x 3. Cooperative, appropriate mood and affect.     Assessment & Plan:   1. Encounter to establish care (Primary) Patient is a 48 year old female who presents today to establish care with primary care at [facility]. Reviewed the past medical history, family history, social history, surgical history, medications and allergies today- updates made as indicated. Patient does not have any concerns today. Various screenings and laboratory blood work was discussed.    2. Counseling for birth control regarding intrauterine device (IUD) Patient reports having Mirena IUD placed in  2015 per chart review. Plans to follow-up with OBGYN to discuss replacing it or just having it removed.   3. Wellness examination Plan to schedule in the near future. Will obtain CBC, CMP, TSH w/ reflux, A1c, and lipid panel. Will also obtain vitamin B12 and vitamin D levels, as discussed in office today.    Return in about 6 weeks (around 03/10/2024) for Physical with fasting labs.   Wilhelmena Hanson, FNP

## 2024-01-28 NOTE — Patient Instructions (Signed)

## 2024-03-03 ENCOUNTER — Ambulatory Visit

## 2024-03-05 ENCOUNTER — Ambulatory Visit

## 2024-03-05 ENCOUNTER — Ambulatory Visit (INDEPENDENT_AMBULATORY_CARE_PROVIDER_SITE_OTHER): Admitting: Family Medicine

## 2024-03-05 DIAGNOSIS — N182 Chronic kidney disease, stage 2 (mild): Secondary | ICD-10-CM

## 2024-03-05 DIAGNOSIS — Z0000 Encounter for general adult medical examination without abnormal findings: Secondary | ICD-10-CM

## 2024-03-06 LAB — CBC WITH DIFFERENTIAL/PLATELET
Basophils Absolute: 0.1 10*3/uL (ref 0.0–0.2)
Basos: 1 %
EOS (ABSOLUTE): 0.1 10*3/uL (ref 0.0–0.4)
Eos: 1 %
Hematocrit: 43.5 % (ref 34.0–46.6)
Hemoglobin: 13.9 g/dL (ref 11.1–15.9)
Immature Grans (Abs): 0 10*3/uL (ref 0.0–0.1)
Immature Granulocytes: 0 %
Lymphocytes Absolute: 1.8 10*3/uL (ref 0.7–3.1)
Lymphs: 24 %
MCH: 30.1 pg (ref 26.6–33.0)
MCHC: 32 g/dL (ref 31.5–35.7)
MCV: 94 fL (ref 79–97)
Monocytes Absolute: 0.6 10*3/uL (ref 0.1–0.9)
Monocytes: 8 %
Neutrophils Absolute: 5.1 10*3/uL (ref 1.4–7.0)
Neutrophils: 66 %
Platelets: 284 10*3/uL (ref 150–450)
RBC: 4.62 x10E6/uL (ref 3.77–5.28)
RDW: 12 % (ref 11.7–15.4)
WBC: 7.7 10*3/uL (ref 3.4–10.8)

## 2024-03-06 LAB — COMPREHENSIVE METABOLIC PANEL WITH GFR
ALT: 37 IU/L — ABNORMAL HIGH (ref 0–32)
AST: 51 IU/L — ABNORMAL HIGH (ref 0–40)
Albumin: 4.5 g/dL (ref 3.9–4.9)
Alkaline Phosphatase: 65 IU/L (ref 44–121)
BUN/Creatinine Ratio: 13 (ref 9–23)
BUN: 12 mg/dL (ref 6–24)
Bilirubin Total: 2.2 mg/dL — ABNORMAL HIGH (ref 0.0–1.2)
CO2: 19 mmol/L — ABNORMAL LOW (ref 20–29)
Calcium: 9.5 mg/dL (ref 8.7–10.2)
Chloride: 105 mmol/L (ref 96–106)
Creatinine, Ser: 0.95 mg/dL (ref 0.57–1.00)
Globulin, Total: 2 g/dL (ref 1.5–4.5)
Glucose: 82 mg/dL (ref 70–99)
Potassium: 4.8 mmol/L (ref 3.5–5.2)
Sodium: 140 mmol/L (ref 134–144)
Total Protein: 6.5 g/dL (ref 6.0–8.5)
eGFR: 75 mL/min/{1.73_m2} (ref >59)

## 2024-03-06 LAB — TSH RFX ON ABNORMAL TO FREE T4: TSH: 2.22 u[IU]/mL (ref 0.450–4.500)

## 2024-03-06 LAB — LIPID PANEL
Chol/HDL Ratio: 2 ratio (ref 0.0–4.4)
Cholesterol, Total: 174 mg/dL (ref 100–199)
HDL: 89 mg/dL (ref >39)
LDL Chol Calc (NIH): 74 mg/dL (ref 0–99)
Triglycerides: 54 mg/dL (ref 0–149)
VLDL Cholesterol Cal: 11 mg/dL (ref 5–40)

## 2024-03-06 LAB — HEMOGLOBIN A1C
Est. average glucose Bld gHb Est-mCnc: 94 mg/dL
Hgb A1c MFr Bld: 4.9 % (ref 4.8–5.6)

## 2024-03-09 NOTE — Progress Notes (Signed)
 Not seen. Labs obtained only.

## 2024-03-10 ENCOUNTER — Encounter: Admitting: Family Medicine

## 2024-03-12 ENCOUNTER — Ambulatory Visit (INDEPENDENT_AMBULATORY_CARE_PROVIDER_SITE_OTHER): Admitting: Family Medicine

## 2024-03-12 ENCOUNTER — Encounter: Payer: Self-pay | Admitting: Family Medicine

## 2024-03-12 ENCOUNTER — Ambulatory Visit: Admitting: Family Medicine

## 2024-03-12 VITALS — BP 119/80 | HR 72 | Temp 97.6°F | Resp 16 | Ht 63.78 in | Wt 149.0 lb

## 2024-03-12 DIAGNOSIS — R7401 Elevation of levels of liver transaminase levels: Secondary | ICD-10-CM | POA: Diagnosis not present

## 2024-03-12 DIAGNOSIS — Z0000 Encounter for general adult medical examination without abnormal findings: Secondary | ICD-10-CM

## 2024-03-12 DIAGNOSIS — R1031 Right lower quadrant pain: Secondary | ICD-10-CM | POA: Diagnosis not present

## 2024-03-12 NOTE — Patient Instructions (Signed)
 Health Maintenance Recommendations Screening Testing Mammogram Every 1 -2 years based on history and risk factors Starting at age 48 Pap Smear Ages 21-39 every 3 years Ages 23-65 every 5 years with HPV testing More frequent testing may be required based on results and history Colon Cancer Screening Every 1-10 years based on test performed, risk factors, and history Starting at age 102 Bone Density Screening Every 2-10 years based on history Starting at age 69 for women Recommendations for men differ based on medication usage, history, and risk factors AAA Screening One time ultrasound Men 30-10 years old who have every smoked Lung Cancer Screening Low Dose Lung CT every 12 months Age 20-80 years with a 30 pack-year smoking history who still smoke or who have quit within the last 15 years   Screening Labs Routine  Labs: Complete Blood Count (CBC), Complete Metabolic Panel (CMP), Cholesterol (Lipid Panel) Every 6-12 months based on history and medications May be recommended more frequently based on current conditions or previous results Hemoglobin A1c Lab Every 3-12 months based on history and previous results Starting at age 24 or earlier with diagnosis of diabetes, high cholesterol, BMI >26, and/or risk factors Frequent monitoring for patients with diabetes to ensure blood sugar control Thyroid Panel (TSH w/ T3 & T4) Every 6 months based on history, symptoms, and risk factors May be repeated more often if on medication HIV One time testing for all patients 23 and older May be repeated more frequently for patients with increased risk factors or exposure Hepatitis C One time testing for all patients 47 and older May be repeated more frequently for patients with increased risk factors or exposure Gonorrhea, Chlamydia Every 12 months for all sexually active persons 13-24 years Additional monitoring may be recommended for those who are considered high risk or who have  symptoms PSA Men 72-66 years old with risk factors Additional screening may be recommended from age 2-69 based on risk factors, symptoms, and history   Vaccine Recommendations Tetanus Booster All adults every 10 years Flu Vaccine All patients 6 months and older every year COVID Vaccine All patients 12 years and older Initial dosing with booster May recommend additional booster based on age and health history HPV Vaccine 2 doses all patients age 56-26 Dosing may be considered for patients over 26 Shingles Vaccine (Shingrix) 2 doses all adults 55 years and older Pneumonia (Pneumovax 23) All adults 65 years and older May recommend earlier dosing based on health history Pneumonia (Prevnar 16) All adults 65 years and older Dosed 1 year after Pneumovax 23   Additional Screening, Testing, and Vaccinations may be recommended on an individualized basis based on family history, health history, risk factors, and/or exposure.  __________________________________________________________   Diet Recommendations for All Patients   I recommend that all patients maintain a diet low in saturated fats, carbohydrates, and cholesterol. While this can be challenging at first, it is not impossible and small changes can make big differences.  Things to try: Decreasing the amount of soda, sweet tea, and/or juice to one or less per day and replace with water While water is always the first choice, if you do not like water you may consider adding a water additive without sugar to improve the taste other sugar free drinks Replace potatoes with a brightly colored vegetable at dinner Use healthy oils, such as canola oil or olive oil, instead of butter or hard margarine Limit your bread intake to two pieces or less a day Replace regular pasta with  low carb pasta options Bake, broil, or grill foods instead of frying Monitor portion sizes  Eat smaller, more frequent meals throughout the day instead of large  meals   An important thing to remember is, if you love foods that are not great for your health, you don't have to give them up completely. Instead, allow these foods to be a reward when you have done well. Allowing yourself to still have special treats every once in a while is a nice way to tell yourself thank you for working hard to keep yourself healthy.    Also remember that every day is a new day. If you have a bad day and "fall off the wagon", you can still climb right back up and keep moving along on your journey!   We have resources available to help you!  Some websites that may be helpful include: www.http://www.wall-moore.info/        Www.VeryWellFit.com _____________________________________________________________   Activity Recommendations for All Patients   I recommend that all adults get at least 20 minutes of moderate physical activity that elevates your heart rate at least 5 days out of the week.  Some examples include: Walking or jogging at a pace that allows you to carry on a conversation Cycling (stationary bike or outdoors) Water aerobics Yoga Weight lifting Dancing If physical limitations prevent you from putting stress on your joints, exercise in a pool or seated in a chair are excellent options.   Do determine your MAXIMUM heart rate for activity: YOUR AGE - 220 = MAX HeartRate    Remember! Do not push yourself too hard.  Start slowly and build up your pace, speed, weight, time in exercise, etc.  Allow your body to rest between exercise and get good sleep. You will need more water than normal when you are exerting yourself. Do not wait until you are thirsty to drink. Drink with a purpose of getting in at least 8, 8 ounce glasses of water a day plus more depending on how much you exercise and sweat.      If you begin to develop dizziness, chest pain, abdominal pain, jaw pain, shortness of breath, headache, vision changes, lightheadedness, or other concerning symptoms, stop the  activity and allow your body to rest. If your symptoms are severe, seek emergency evaluation immediately. If your symptoms are concerning, but not severe, please let us know so that we can recommend further evaluation.

## 2024-03-12 NOTE — Progress Notes (Signed)
 Subjective:   Mikayla Bradley Oct 26, 1975  03/12/2024   CC: Chief Complaint  Patient presents with   Follow-up    Pt. Here for a follow-up visit.   HPI: Mikayla Bradley is a 48 y.o. female who presents for a routine health maintenance exam.  Fasting labs collected previously visit.  IUD- placed Feb 2019- does not get regular menstrual cycles   HEALTH SCREENINGS: - Vision Screening: plans to schedule - Dental Visits: up to date - Pap smear: up to date - Breast Exam: up to date - STD Screening: declined - Mammogram (40+): Up to date  - Colonoscopy (45+): Up to date  - Bone Density (65+ or under 65 with predisposing conditions): Not applicable  - Lung CA screening with low-dose CT:  Not applicable Adults age 56-80 who are current cigarette smokers or quit within the last 15 years. Must have 20 pack year history.   Depression and Anxiety Screen done today and results listed below:     03/12/2024    8:09 AM 01/28/2024   10:37 AM 01/08/2023   11:28 AM 04/24/2022    9:36 AM 01/14/2022    2:45 PM  Depression screen PHQ 2/9  Decreased Interest 0 0 0 0 0  Down, Depressed, Hopeless 1 0 0 1 0  PHQ - 2 Score 1 0 0 1 0  Altered sleeping 1 0     Tired, decreased energy 1 0  0   Change in appetite 0 0  0   Feeling bad or failure about yourself  0 0  0   Trouble concentrating 1 0  0   Moving slowly or fidgety/restless 0 0  0   Suicidal thoughts 0 0  0   PHQ-9 Score 4 0     Difficult doing work/chores Not difficult at all Not difficult at all  Not difficult at all       03/12/2024    8:09 AM 01/28/2024   10:37 AM 04/24/2022    9:36 AM  GAD 7 : Generalized Anxiety Score  Nervous, Anxious, on Edge  0 0  Control/stop worrying 0 0 0  Worry too much - different things 0 0 0  Trouble relaxing 0 0 0  Restless 0 0 0  Easily annoyed or irritable 0 0 0  Afraid - awful might happen 0 0 0  Total GAD 7 Score  0 0  Anxiety Difficulty Not difficult at all Not difficult at all Not  difficult at all    IMMUNIZATIONS: - Tdap: Tetanus vaccination status reviewed: last tetanus booster within 10 years. - HPV: Not applicable - Influenza: N/A - Pneumovax: Not applicable - Prevnar 20: Not applicable - Zostavax (50+): Not applicable   Past medical history, surgical history, medications, allergies, family history and social history reviewed with patient today and changes made to appropriate areas of the chart.   Past Medical History:  Diagnosis Date   No pertinent past medical history     Past Surgical History:  Procedure Laterality Date   BREAST EXCISIONAL BIOPSY Right 2019   COLON SURGERY      Current Outpatient Medications on File Prior to Visit  Medication Sig   ketoconazole (NIZORAL) 2 % cream Apply topically.   levonorgestrel (MIRENA) 20 MCG/DAY IUD 1 each by Intrauterine route once.   No current facility-administered medications on file prior to visit.    No Known Allergies   Social History   Socioeconomic History   Marital status: Married    Spouse  name: Everette   Number of children: 1   Years of education: Not on file   Highest education level: Bachelor's degree (e.g., BA, AB, BS)  Occupational History   Not on file  Tobacco Use   Smoking status: Former    Current packs/day: 0.00    Types: Cigarettes    Quit date: 2005    Years since quitting: 20.4    Passive exposure: Past   Smokeless tobacco: Never  Vaping Use   Vaping status: Never Used  Substance and Sexual Activity   Alcohol use: Yes    Comment: socially   Drug use: Never   Sexual activity: Yes    Partners: Male    Birth control/protection: I.U.D.    Comment: Mirena  Other Topics Concern   Not on file  Social History Narrative    Pre K Teacher    Social Drivers of Health   Financial Resource Strain: Low Risk  (01/08/2023)   Overall Financial Resource Strain (CARDIA)    Difficulty of Paying Living Expenses: Not very hard  Food Insecurity: No Food Insecurity  (01/08/2023)   Hunger Vital Sign    Worried About Running Out of Food in the Last Year: Never true    Ran Out of Food in the Last Year: Never true  Transportation Needs: No Transportation Needs (01/08/2023)   PRAPARE - Administrator, Civil Service (Medical): No    Lack of Transportation (Non-Medical): No  Physical Activity: Unknown (01/08/2023)   Exercise Vital Sign    Days of Exercise per Week: 0 days    Minutes of Exercise per Session: Not on file  Recent Concern: Physical Activity - Inactive (01/08/2023)   Exercise Vital Sign    Days of Exercise per Week: 0 days    Minutes of Exercise per Session: 30 min  Stress: No Stress Concern Present (01/08/2023)   [employer] of Occupational Health - Occupational Stress Questionnaire    Feeling of Stress : Only a little  Social Connections: Unknown (01/08/2023)   Social Connection and Isolation Panel [NHANES]    Frequency of Communication with Friends and Family: More than three times a week    Frequency of Social Gatherings with Friends and Family: Twice a week    Attends Religious Services: More than 4 times per year    Active Member of [employer] or Organizations: Patient declined    Attends Banker Meetings: Not on file    Marital Status: Married  Intimate Partner Violence: Not At Risk (04/24/2022)   Humiliation, Afraid, Rape, and Kick questionnaire    Fear of Current or Ex-Partner: No    Emotionally Abused: No    Physically Abused: No    Sexually Abused: No   Social History   Tobacco Use  Smoking Status Former   Current packs/day: 0.00   Types: Cigarettes   Quit date: 2005   Years since quitting: 20.4   Passive exposure: Past  Smokeless Tobacco Never   Social History   Substance and Sexual Activity  Alcohol Use Yes   Comment: socially    Family History  Problem Relation Age of Onset   Colon polyps Mother    Ovarian cancer Paternal Aunt    Breast cancer Neg Hx     ROS: Denies fever, fatigue,  unexplained weight loss/gain, chest pain, SHOB, and palpitations. Denies neurological deficits, gastrointestinal or genitourinary complaints, and skin changes.   Objective:   Today's Vitals   03/12/24 0810  BP: 119/80  Pulse:  72  Resp: 16  Temp: 97.6 F (36.4 C)  TempSrc: Oral  SpO2: 99%  Weight: 149 lb (67.6 kg)  Height: 5' 3.78" (1.62 m)  PainSc: 0-No pain    GENERAL APPEARANCE: Well-appearing, in NAD. Well nourished.  SKIN: Pink, warm and dry. Turgor normal. No rash, lesion, ulceration, or ecchymoses. Hair evenly distributed.  HEENT: HEAD: Normocephalic.  EYES: PERRLA. EOMI. Lids intact w/o defect. Sclera white, Conjunctiva pink w/o exudate.  EARS: External ear w/o redness, swelling, masses or lesions. EAC clear. TM's intact, translucent w/o bulging, appropriate landmarks visualized. Appropriate acuity to conversational tones.  NOSE: Septum midline w/o deformity. Nares patent, mucosa pink and non-inflamed w/o drainage. No sinus tenderness.  THROAT: Uvula midline. Oropharynx clear. Tonsils non-inflamed w/o exudate. Oral mucosa pink and moist.  NECK: Supple, Trachea midline. Full ROM w/o pain or tenderness. No lymphadenopathy. Thyroid non-tender w/o enlargement or palpable masses.  BREASTS: Deferred.  RESPIRATORY: Chest wall symmetrical w/o masses. Respirations even and non-labored. Breath sounds clear to auscultation bilaterally. No wheezes, rales, rhonchi, or crackles. CARDIAC: S1, S2 present, regular rate and rhythm. No gallops, murmurs, rubs, or clicks. PMI w/o lifts, heaves, or thrills. No carotid bruits. Capillary refill <2 seconds. Peripheral pulses 2+ bilaterally. GI: Abdomen soft w/o distention. Normoactive bowel sounds. No palpable masses or tenderness. No guarding or rebound tenderness. Slight tenderness to palpation in RLQ. Liver and spleen w/o tenderness or enlargement. No CVA tenderness.  GU: Deferred.   MSK: Muscle tone and strength appropriate for age, w/o atrophy or  abnormal movement.  EXTREMITIES: Active ROM intact, w/o tenderness, crepitus, or contracture. No obvious joint deformities or effusions. No clubbing, edema, or cyanosis.  NEUROLOGIC: CN's II-XII intact. Motor strength symmetrical with no obvious weakness. No sensory deficits. DTR's 2+ symmetric bilaterally. Steady, even gait.  PSYCH/MENTAL STATUS: Alert, oriented x 3. Cooperative, appropriate mood and affect.   Results for orders placed or performed in visit on 03/05/24  CBC with Differential/Platelet   Collection Time: 03/05/24  8:26 AM  Result Value Ref Range   WBC 7.7 3.4 - 10.8 x10E3/uL   RBC 4.62 3.77 - 5.28 x10E6/uL   Hemoglobin 13.9 11.1 - 15.9 g/dL   Hematocrit 16.1 09.6 - 46.6 %   MCV 94 79 - 97 fL   MCH 30.1 26.6 - 33.0 pg   MCHC 32.0 31.5 - 35.7 g/dL   RDW 04.5 40.9 - 81.1 %   Platelets 284 150 - 450 x10E3/uL   Neutrophils 66 Not Estab. %   Lymphs 24 Not Estab. %   Monocytes 8 Not Estab. %   Eos 1 Not Estab. %   Basos 1 Not Estab. %   Neutrophils Absolute 5.1 1.4 - 7.0 x10E3/uL   Lymphocytes Absolute 1.8 0.7 - 3.1 x10E3/uL   Monocytes Absolute 0.6 0.1 - 0.9 x10E3/uL   EOS (ABSOLUTE) 0.1 0.0 - 0.4 x10E3/uL   Basophils Absolute 0.1 0.0 - 0.2 x10E3/uL   Immature Granulocytes 0 Not Estab. %   Immature Grans (Abs) 0.0 0.0 - 0.1 x10E3/uL  Comprehensive metabolic panel with GFR   Collection Time: 03/05/24  8:26 AM  Result Value Ref Range   Glucose 82 70 - 99 mg/dL   BUN 12 6 - 24 mg/dL   Creatinine, Ser 9.14 0.57 - 1.00 mg/dL   eGFR 75 >78 GN/FAO/1.30   BUN/Creatinine Ratio 13 9 - 23   Sodium 140 134 - 144 mmol/L   Potassium 4.8 3.5 - 5.2 mmol/L   Chloride 105 96 - 106 mmol/L  CO2 19 (L) 20 - 29 mmol/L   Calcium 9.5 8.7 - 10.2 mg/dL   Total Protein 6.5 6.0 - 8.5 g/dL   Albumin 4.5 3.9 - 4.9 g/dL   Globulin, Total 2.0 1.5 - 4.5 g/dL   Bilirubin Total 2.2 (H) 0.0 - 1.2 mg/dL   Alkaline Phosphatase 65 44 - 121 IU/L   AST 51 (H) 0 - 40 IU/L   ALT 37 (H) 0 - 32 IU/L   Hemoglobin A1c   Collection Time: 03/05/24  8:26 AM  Result Value Ref Range   Hgb A1c MFr Bld 4.9 4.8 - 5.6 %   Est. average glucose Bld gHb Est-mCnc 94 mg/dL  Lipid panel   Collection Time: 03/05/24  8:26 AM  Result Value Ref Range   Cholesterol, Total 174 100 - 199 mg/dL   Triglycerides 54 0 - 149 mg/dL   HDL 89 >82 mg/dL   VLDL Cholesterol Cal 11 5 - 40 mg/dL   LDL Chol Calc (NIH) 74 0 - 99 mg/dL   Chol/HDL Ratio 2.0 0.0 - 4.4 ratio  TSH Rfx on Abnormal to Free T4   Collection Time: 03/05/24  8:26 AM  Result Value Ref Range   TSH 2.220 0.450 - 4.500 uIU/mL    Assessment & Plan:   1. Wellness examination (Primary) - Encouraged a healthy well-balanced diet. Patient may adjust caloric intake to maintain or achieve ideal body weight. May reduce intake of dietary saturated fat and total fat and have adequate dietary potassium and calcium preferably from fresh fruits, vegetables, and low-fat dairy products.   - Advised to avoid cigarette smoking. - Discussed with the patient that most people either abstain from alcohol or drink within safe limits (<=14/week and <=4 drinks/occasion for males, <=7/weeks and <= 3 drinks/occasion for females) and that the risk for alcohol disorders and other health effects rises proportionally with the number of drinks per week and how often a drinker exceeds daily limits. - Discussed cessation/primary prevention of drug use and availability of treatment for abuse.  - Discussed sexually transmitted diseases, avoidance of unintended pregnancy and contraceptive alternatives. - Stressed the importance of regular exercise - Injury prevention: Discussed safety belts, safety helmets, smoke detector, smoking near bedding or upholstery.  - Dental health: Discussed importance of regular tooth brushing, flossing, and dental visits.  NEXT PREVENTATIVE PHYSICAL DUE IN 1 YEAR.  2. RLQ abdominal pain Patient presents today with concerns about RLQ pain with palpation  for an unknown period of time. She does have a history of ovarian cysts and reports she does not have regular menstrual cycles due to IUD, placed in 2019. Denies nausea, vomiting, fever/chills, dysphagia, early satiety, changes to bowel habits, blood in stool, urinary symptoms, flank pain, weight loss, and decreased appetite. Abdomen soft w/o distention. Normoactive bowel sounds. No palpable masses. No guarding or rebound tenderness. Slight tenderness to palpation in RLQ. Liver and spleen w/o tenderness or enlargement. No CVA tenderness. Discussed possible differential diagnoses- will obtain TVUS to determine if ovarian cyst is present. Discussed emergency precautions and when to return to office.  - US  PELVIC COMPLETE WITH TRANSVAGINAL; Future  3. Transaminitis ALT slightly elevated than greater than AST. She reports drinking socially the night before. Discussed possibly alcohol induced. Will repeat in 6-8 weeks.  - Comprehensive metabolic panel with GFR; Future  Return in about 1 year (around 03/12/2025) for Physical with fasting labs.  Patient to reach out to office if new, worrisome, or unresolved symptoms arise or if no  improvement in patient's condition. Patient verbalized understanding and is agreeable to treatment plan. All questions answered to patient's satisfaction.   Wilhelmena Hanson, FNP

## 2024-03-15 ENCOUNTER — Encounter: Admitting: Family Medicine

## 2024-03-24 ENCOUNTER — Other Ambulatory Visit

## 2024-03-31 ENCOUNTER — Ambulatory Visit
Admission: RE | Admit: 2024-03-31 | Discharge: 2024-03-31 | Disposition: A | Discharge status: Home / Self Care | Source: Ambulatory Visit | Attending: Family Medicine | Admitting: Family Medicine

## 2024-03-31 DIAGNOSIS — R1031 Right lower quadrant pain: Secondary | ICD-10-CM

## 2024-04-07 ENCOUNTER — Ambulatory Visit: Payer: Self-pay | Admitting: Family Medicine

## 2024-04-12 ENCOUNTER — Encounter: Payer: Self-pay | Admitting: Family Medicine

## 2024-04-12 ENCOUNTER — Ambulatory Visit (INDEPENDENT_AMBULATORY_CARE_PROVIDER_SITE_OTHER): Admitting: Family Medicine

## 2024-04-12 VITALS — BP 116/72 | HR 107 | Resp 16 | Ht 63.7 in | Wt 148.6 lb

## 2024-04-12 DIAGNOSIS — H01133 Eczematous dermatitis of right eye, unspecified eyelid: Secondary | ICD-10-CM | POA: Diagnosis not present

## 2024-04-12 NOTE — Patient Instructions (Signed)

## 2024-04-12 NOTE — Progress Notes (Signed)
 Acute Care Office Visit  Subjective:   Mikayla Bradley 12/02/1975 04/12/2024  Chief Complaint  Patient presents with   Rash   HPI: Presents today for an acute visit with complaint of R eye rash, started as a red spot. Put some cortisone cream used it sparingly.  She reports that it is improving, about 2 days ago upper eyelid was discolored than normal and slightly swollen.  Symptoms have been present for the past two weeks.  Associated symptoms include: somewhat itchy, sensitive to touch Pertinent negatives:  watery eyes, allergy symptoms, crusting   Treatments tried include: cortisone cream OTC  Treatment effective: yes  Has not changed skin creams, soaps, or laundry detergents   The following portions of the patient's history were reviewed and updated as appropriate: past medical history, past surgical history, family history, social history, allergies, medications, and problem list.   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  H/o biopsy proven fibroadenoma. Continues to endorse discomfort in right breast. Firm oval shaped breast lump measuring 2cm x1.5 cm on exam today.    -- referral made for consultation with [facility]     No pertinent past medical history    Past Surgical History:  Procedure Laterality Date   BREAST EXCISIONAL BIOPSY Right 2019   BREAST SURGERY  2019   Fibroadenoma removed   COLON SURGERY     Family History  Problem Relation Age of Onset   Colon polyps Mother    Ovarian cancer Paternal Aunt    Breast  cancer Neg Hx    Outpatient Medications Prior to Visit  Medication Sig Dispense Refill   levonorgestrel (MIRENA) 20 MCG/DAY IUD 1 each by Intrauterine route once.     ketoconazole (NIZORAL) 2 % cream Apply topically. (Patient not taking: Reported on 04/12/2024)     No facility-administered medications prior to visit.   No Known Allergies  ROS: A complete ROS was performed with pertinent positives/negatives noted in the HPI. The remainder of the ROS are negative.    Objective:   Today's Vitals   04/12/24 1323  BP: 116/72  Pulse: (!) 107  Resp: 16  Weight: 148 lb 9.6 oz (67.4 kg)  Height: 5' 3.7 (1.618 m)  PainSc: 0-No pain   Physical Exam Vitals reviewed.  Constitutional:      Appearance: Normal appearance.  HENT:     Head:    Eyes:     General: Lids are normal. No allergic shiner.       Right eye: No foreign body, discharge or hordeolum.        Left eye: No foreign body, discharge or hordeolum.     Extraocular Movements:     Right eye: Normal extraocular motion and no nystagmus.     Left eye: Normal extraocular motion and no nystagmus.     Conjunctiva/sclera:     Right eye: Right conjunctiva is not injected. No chemosis, exudate or hemorrhage.    Left eye: Left conjunctiva is not injected. No chemosis, exudate or hemorrhage.  Cardiovascular:     Rate and Rhythm: Normal rate and regular rhythm.     Pulses: Normal pulses.     Heart sounds: Normal heart sounds.  Pulmonary:     Effort: Pulmonary effort is normal.     Breath sounds: Normal breath sounds.   Skin:    Findings: Rash present. Rash is macular.   Neurological:     Mental Status: She is alert.   Psychiatric:        Mood and Affect: Mood normal.        Behavior: Behavior normal.      Assessment & Plan:   1. Atopic dermatitis of right eyelid (Primary) Patient is a pleasant 48 year old female patient who presents today for concerns for an acute rash that has been present for the past 2 weeks.  Physical exam with normal conjunctiva, normal eyelids, and no discharge present. Faint macular rash present below right eye. Does not appear infected. Unclear etiology. Advised patient to use hydrocortisone sparingly on affected area for 3-5 days consistently. Advised patient to return to office if rash persists or worsens.   Return if symptoms worsen or fail to improve.    Patient to reach out to office if new, worrisome, or unresolved symptoms arise or if no improvement in patient's condition. Patient verbalized understanding and is agreeable to treatment plan. All questions answered to patient's satisfaction.    Evalene Arts, FNP

## 2024-11-01 ENCOUNTER — Ambulatory Visit: Admitting: Family Medicine

## 2024-11-01 ENCOUNTER — Other Ambulatory Visit: Payer: Self-pay | Admitting: Obstetrics and Gynecology

## 2024-11-01 VITALS — BP 115/78 | HR 80 | Resp 15 | Ht 63.7 in | Wt 155.6 lb

## 2024-11-01 DIAGNOSIS — R1084 Generalized abdominal pain: Secondary | ICD-10-CM

## 2024-11-01 DIAGNOSIS — R5383 Other fatigue: Secondary | ICD-10-CM

## 2024-11-01 DIAGNOSIS — Z1231 Encounter for screening mammogram for malignant neoplasm of breast: Secondary | ICD-10-CM

## 2024-11-01 NOTE — Progress Notes (Signed)
 "     Acute Care Office Visit  Subjective:   Mikayla Bradley November 25, 1975 11/01/2024  Chief Complaint  Patient presents with   Abdominal Pain    Right side    Discussed the use of AI scribe software for clinical note transcription with the patient, who gave verbal consent to proceed.  History of Present Illness   Mikayla Bradley is a 49 year old female who presents with abdominal pain.  She experiences sharp abdominal pain (in the R upper/lower region) primarily when relaxed or lying in bed, described as akin to a muscle being overstretched. The pain is located under the ribs on the right side and occasionally radiates slightly to the front. It is intermittent but was present on the day of the visit.  She has a history of elevated liver function tests over the past five years, which have recently normalized. Her concern about abdominal pain is heightened due to her husband's history of pancreatic cancer, causing anxiety about internal symptoms.  No nausea, vomiting, fever, or chills. She recalls feeling unwell after consuming fried chicken around the [place] but cannot consistently link her pain to specific foods. Bowel movements are irregular, with changes in consistency and requiring straining. She has considered using stool softeners like Senokot to help with bowel movement consistency.      The following portions of the patient's history were reviewed and updated as appropriate: past medical history, past surgical history, family history, social history, allergies, medications, and problem list.   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  H/o biopsy proven fibroadenoma. Continues to endorse discomfort in right breast. Firm oval shaped breast lump measuring 2cm x1.5 cm on exam today.    -- referral made for consultation with [facility]     No pertinent past medical history    Past Surgical History:  Procedure Laterality Date   BREAST EXCISIONAL BIOPSY Right 2019   BREAST SURGERY  2019   Fibroadenoma removed   COLON SURGERY     Family History  Problem Relation Age of Onset   Colon polyps Mother    Ovarian cancer Paternal Aunt    Breast cancer Neg Hx    Outpatient Medications Prior to Visit  Medication Sig Dispense Refill   levonorgestrel (MIRENA) 20 MCG/DAY IUD 1 each by Intrauterine route once.     ketoconazole (NIZORAL) 2 % cream Apply topically. (Patient not taking: Reported on 11/01/2024)     No facility-administered medications prior to visit.   Allergies[1]  ROS: A complete ROS was performed with pertinent positives/negatives noted in the HPI. The remainder of the ROS are negative.    Objective:   Today's Vitals   11/01/24 1453  BP: 115/78  Pulse: 80  Resp: 15  SpO2: 98%  Weight: 155 lb 9.6 oz (70.6 kg)  Height: 5' 3.7 (1.618 m)  PainSc: 0-No pain   Physical Exam Vitals reviewed.  Constitutional:      Appearance: She is well-developed.  Cardiovascular:  Rate and Rhythm: Normal rate and regular rhythm.     Pulses: Normal pulses.     Heart sounds: Normal heart sounds.  Pulmonary:     Effort: Pulmonary effort is normal.     Breath sounds: Normal breath sounds.  Abdominal:     General: Abdomen is flat. Bowel sounds are normal. There is no distension or abdominal bruit. There are no signs of injury.     Palpations: Abdomen is soft. There is no shifting dullness, hepatomegaly, splenomegaly, mass or pulsatile mass.     Tenderness: There is  abdominal tenderness in the right upper quadrant and right lower quadrant.  Neurological:     Mental Status: She is alert.  Psychiatric:        Mood and Affect: Mood normal.        Behavior: Behavior normal.      Assessment & Plan:   1. Generalized abdominal pain (Primary) Intermittent sharp pain under ribs, right side. Differential includes gallbladder, liver, pancreatic issues. Normal liver function tests. Patient is well-appearing and in no acute distress. Denies N/V, diarrhea, fever/chills, change in appetite, changes in bowel habits, dysuria, urinary urgency/frequency, hematuria, syncopal episodes, unexplained weight loss, intractable vomiting, and recent antibiotic use. She does report occasional constipation and is considering taking an OTC stool softener. Physical exam reveals tenderness present in the right lower quadrant of her abdomen. No rebound tenderness or guarding present.  Discussed imaging for further work-up due to generalized pain and benign physical exam. Patient is agreeable to plan of care. Counseled regarding ED precautions and when to return to office.   - CT ABDOMEN PELVIS W CONTRAST; Future  2. Other fatigue Difficulty sleeping with ongoing symptoms of fatigue. Patient would like to assess mineral and vitamin deficiencies. Ordered blood tests for vitamin D , B12, and magnesium levels.  - VITAMIN D  25 Hydroxy (Vit-D Deficiency, Fractures) - Vitamin B12 - Magnesium  Lab Orders         VITAMIN D  25 Hydroxy (Vit-D Deficiency, Fractures)         Vitamin B12         Magnesium      Return in about 4 months (around 03/13/2025) for Physical with fasting labs.    Patient to reach out to office if new, worrisome, or unresolved symptoms arise or if no improvement in patient's condition. Patient verbalized understanding and is agreeable to treatment plan. All questions answered to patient's satisfaction.    Evalene Arts, FNP      [1] No Known Allergies  "

## 2024-11-01 NOTE — Patient Instructions (Signed)

## 2024-11-02 ENCOUNTER — Ambulatory Visit: Payer: Self-pay | Admitting: Family Medicine

## 2024-11-02 LAB — MAGNESIUM: Magnesium: 2.1 mg/dL (ref 1.6–2.3)

## 2024-11-02 LAB — VITAMIN D 25 HYDROXY (VIT D DEFICIENCY, FRACTURES): Vit D, 25-Hydroxy: 19.5 ng/mL — ABNORMAL LOW (ref 30.0–100.0)

## 2024-11-02 LAB — VITAMIN B12: Vitamin B-12: 329 pg/mL (ref 232–1245)

## 2024-11-25 ENCOUNTER — Ambulatory Visit

## 2024-12-13 ENCOUNTER — Ambulatory Visit: Admitting: Registered Nurse
